# Patient Record
Sex: Male | Born: 1965 | Hispanic: No | Marital: Married | Smoking: Never smoker
Health system: Southern US, Community
[De-identification: ages and names within clinical notes are randomized; demographics above are authoritative.]

## PROBLEM LIST (undated history)

## (undated) DIAGNOSIS — Z972 Presence of dental prosthetic device (complete) (partial): Secondary | ICD-10-CM

## (undated) DIAGNOSIS — E119 Type 2 diabetes mellitus without complications: Secondary | ICD-10-CM

## (undated) HISTORY — PX: CHOLECYSTECTOMY: SHX55

## (undated) HISTORY — PX: APPENDECTOMY: SHX54

---

## 2001-02-15 ENCOUNTER — Emergency Department (HOSPITAL_COMMUNITY): Admission: EM | Admit: 2001-02-15 | Discharge: 2001-02-15 | Payer: Self-pay

## 2004-06-02 ENCOUNTER — Inpatient Hospital Stay: Payer: Self-pay

## 2004-06-02 ENCOUNTER — Other Ambulatory Visit: Payer: Self-pay

## 2004-06-22 ENCOUNTER — Ambulatory Visit: Payer: Self-pay

## 2004-11-20 ENCOUNTER — Other Ambulatory Visit: Payer: Self-pay

## 2004-11-20 ENCOUNTER — Emergency Department: Payer: Self-pay | Admitting: Emergency Medicine

## 2005-01-05 ENCOUNTER — Emergency Department: Payer: Self-pay | Admitting: Emergency Medicine

## 2005-01-05 ENCOUNTER — Other Ambulatory Visit: Payer: Self-pay

## 2005-01-25 ENCOUNTER — Ambulatory Visit: Payer: Self-pay

## 2006-05-07 ENCOUNTER — Emergency Department: Payer: Self-pay | Admitting: Emergency Medicine

## 2006-11-09 ENCOUNTER — Ambulatory Visit: Payer: Self-pay | Admitting: Internal Medicine

## 2007-09-28 ENCOUNTER — Ambulatory Visit: Payer: Self-pay | Admitting: Internal Medicine

## 2008-09-08 ENCOUNTER — Ambulatory Visit: Payer: Self-pay | Admitting: Internal Medicine

## 2009-02-26 ENCOUNTER — Emergency Department: Payer: Self-pay | Admitting: Emergency Medicine

## 2009-03-03 ENCOUNTER — Ambulatory Visit: Payer: Self-pay | Admitting: Gastroenterology

## 2010-12-13 ENCOUNTER — Other Ambulatory Visit: Payer: Self-pay | Admitting: *Deleted

## 2010-12-13 ENCOUNTER — Ambulatory Visit
Admission: RE | Admit: 2010-12-13 | Discharge: 2010-12-13 | Disposition: A | Discharge status: Home / Self Care | Payer: Self-pay | Source: Ambulatory Visit | Attending: *Deleted | Admitting: *Deleted

## 2010-12-13 DIAGNOSIS — M545 Low back pain: Secondary | ICD-10-CM

## 2010-12-13 DIAGNOSIS — Z139 Encounter for screening, unspecified: Secondary | ICD-10-CM

## 2010-12-14 ENCOUNTER — Ambulatory Visit
Admission: RE | Admit: 2010-12-14 | Discharge: 2010-12-14 | Disposition: A | Discharge status: Home / Self Care | Payer: Self-pay | Source: Ambulatory Visit | Attending: Anesthesiology | Admitting: Anesthesiology

## 2010-12-14 ENCOUNTER — Other Ambulatory Visit: Payer: Self-pay | Admitting: Anesthesiology

## 2010-12-15 ENCOUNTER — Ambulatory Visit
Admission: RE | Admit: 2010-12-15 | Discharge: 2010-12-15 | Disposition: A | Discharge status: Home / Self Care | Payer: Self-pay | Source: Ambulatory Visit | Attending: *Deleted | Admitting: *Deleted

## 2010-12-15 DIAGNOSIS — M545 Low back pain: Secondary | ICD-10-CM

## 2010-12-15 MED ORDER — GADOBENATE DIMEGLUMINE 529 MG/ML IV SOLN
16.0000 mL | Freq: Once | INTRAVENOUS | Status: AC | PRN
  Administered 2010-12-15: 16 mL via INTRAVENOUS

## 2010-12-22 ENCOUNTER — Ambulatory Visit: Payer: Self-pay | Admitting: Gastroenterology

## 2010-12-24 LAB — PATHOLOGY REPORT

## 2011-01-04 ENCOUNTER — Other Ambulatory Visit: Payer: Self-pay | Admitting: Internal Medicine

## 2011-01-04 DIAGNOSIS — R1110 Vomiting, unspecified: Secondary | ICD-10-CM

## 2011-01-05 ENCOUNTER — Other Ambulatory Visit: Payer: Self-pay | Admitting: Internal Medicine

## 2011-01-05 ENCOUNTER — Ambulatory Visit
Admission: RE | Admit: 2011-01-05 | Discharge: 2011-01-05 | Disposition: A | Discharge status: Home / Self Care | Payer: No Typology Code available for payment source | Source: Ambulatory Visit | Attending: Internal Medicine | Admitting: Internal Medicine

## 2011-01-05 ENCOUNTER — Inpatient Hospital Stay: Admission: RE | Admit: 2011-01-05 | Payer: Self-pay | Source: Ambulatory Visit

## 2011-01-05 MED ORDER — IOHEXOL 300 MG/ML  SOLN
100.0000 mL | Freq: Once | INTRAMUSCULAR | Status: AC | PRN
  Administered 2011-01-05: 100 mL via INTRAVENOUS

## 2013-12-27 ENCOUNTER — Inpatient Hospital Stay: Payer: Self-pay | Admitting: Surgery

## 2013-12-27 LAB — URINALYSIS, COMPLETE
BACTERIA: NEGATIVE
Bilirubin,UR: NEGATIVE
Blood: NEGATIVE
GLUCOSE, UR: NEGATIVE mg/dL (ref 0–75)
Ketone: NEGATIVE
Leukocyte Esterase: NEGATIVE
NITRITE: NEGATIVE
PROTEIN: NEGATIVE
Ph: 7 (ref 4.5–8.0)
RBC,UR: NONE SEEN /HPF (ref 0–5)
Specific Gravity: 1.046 (ref 1.003–1.030)
WBC UR: NONE SEEN /HPF (ref 0–5)

## 2013-12-27 LAB — LIPASE, BLOOD: Lipase: 156 U/L (ref 73–393)

## 2013-12-27 LAB — CBC WITH DIFFERENTIAL/PLATELET
Basophil #: 0 10*3/uL (ref 0.0–0.1)
Basophil %: 0.2 %
Eosinophil #: 0 10*3/uL (ref 0.0–0.7)
Eosinophil %: 0 %
HCT: 42.9 % (ref 40.0–52.0)
HGB: 13.6 g/dL (ref 13.0–18.0)
Lymphocyte #: 0.6 10*3/uL — ABNORMAL LOW (ref 1.0–3.6)
Lymphocyte %: 5.6 %
MCH: 26.6 pg (ref 26.0–34.0)
MCHC: 31.8 g/dL — ABNORMAL LOW (ref 32.0–36.0)
MCV: 84 fL (ref 80–100)
Monocyte #: 0.5 x10 3/mm (ref 0.2–1.0)
Monocyte %: 5 %
Neutrophil #: 9.8 10*3/uL — ABNORMAL HIGH (ref 1.4–6.5)
Neutrophil %: 89.2 %
Platelet: 162 10*3/uL (ref 150–440)
RBC: 5.12 10*6/uL (ref 4.40–5.90)
RDW: 14.4 % (ref 11.5–14.5)
WBC: 11 10*3/uL — ABNORMAL HIGH (ref 3.8–10.6)

## 2013-12-27 LAB — COMPREHENSIVE METABOLIC PANEL
ALK PHOS: 87 U/L
Albumin: 4 g/dL (ref 3.4–5.0)
Anion Gap: 9 (ref 7–16)
BUN: 12 mg/dL (ref 7–18)
Bilirubin,Total: 0.8 mg/dL (ref 0.2–1.0)
CALCIUM: 8.9 mg/dL (ref 8.5–10.1)
CHLORIDE: 104 mmol/L (ref 98–107)
CO2: 27 mmol/L (ref 21–32)
Creatinine: 1.1 mg/dL (ref 0.60–1.30)
EGFR (Non-African Amer.): >60
GLUCOSE: 191 mg/dL — AB (ref 65–99)
Osmolality: 284 (ref 275–301)
Potassium: 4.1 mmol/L (ref 3.5–5.1)
SGOT(AST): 21 U/L (ref 15–37)
SGPT (ALT): 21 U/L (ref 12–78)
Sodium: 140 mmol/L (ref 136–145)
Total Protein: 7.9 g/dL (ref 6.4–8.2)

## 2013-12-27 LAB — SEDIMENTATION RATE: ERYTHROCYTE SED RATE: 6 mm/h (ref 0–15)

## 2013-12-27 LAB — TROPONIN I: Troponin-I: <0.02 ng/mL

## 2013-12-27 LAB — HEMOGLOBIN A1C: Hemoglobin A1C: 6.2 % (ref 4.2–6.3)

## 2013-12-28 LAB — CBC WITH DIFFERENTIAL/PLATELET
BASOS ABS: 0 10*3/uL (ref 0.0–0.1)
Basophil %: 0.1 %
Eosinophil #: 0 10*3/uL (ref 0.0–0.7)
Eosinophil %: 0 %
HCT: 36.2 % — ABNORMAL LOW (ref 40.0–52.0)
HGB: 12.2 g/dL — ABNORMAL LOW (ref 13.0–18.0)
Lymphocyte #: 0.6 10*3/uL — ABNORMAL LOW (ref 1.0–3.6)
Lymphocyte %: 6.7 %
MCH: 28.3 pg (ref 26.0–34.0)
MCHC: 33.6 g/dL (ref 32.0–36.0)
MCV: 84 fL (ref 80–100)
MONO ABS: 0.3 x10 3/mm (ref 0.2–1.0)
MONOS PCT: 3.2 %
NEUTROS ABS: 7.8 10*3/uL — AB (ref 1.4–6.5)
NEUTROS PCT: 90 %
Platelet: 141 10*3/uL — ABNORMAL LOW (ref 150–440)
RBC: 4.31 10*6/uL — AB (ref 4.40–5.90)
RDW: 14.5 % (ref 11.5–14.5)
WBC: 8.7 10*3/uL (ref 3.8–10.6)

## 2013-12-28 LAB — COMPREHENSIVE METABOLIC PANEL
ALK PHOS: 70 U/L
ALT: 16 U/L (ref 12–78)
ANION GAP: 6 — AB (ref 7–16)
Albumin: 3.2 g/dL — ABNORMAL LOW (ref 3.4–5.0)
BUN: 11 mg/dL (ref 7–18)
Bilirubin,Total: 0.8 mg/dL (ref 0.2–1.0)
CALCIUM: 8.5 mg/dL (ref 8.5–10.1)
CO2: 27 mmol/L (ref 21–32)
CREATININE: 0.84 mg/dL (ref 0.60–1.30)
Chloride: 108 mmol/L — ABNORMAL HIGH (ref 98–107)
EGFR (Non-African Amer.): >60
Glucose: 152 mg/dL — ABNORMAL HIGH (ref 65–99)
Osmolality: 284 (ref 275–301)
POTASSIUM: 4.2 mmol/L (ref 3.5–5.1)
SGOT(AST): 15 U/L (ref 15–37)
Sodium: 141 mmol/L (ref 136–145)
TOTAL PROTEIN: 6.5 g/dL (ref 6.4–8.2)

## 2013-12-29 LAB — CBC WITH DIFFERENTIAL/PLATELET
Basophil #: 0 x10 3/mm 3
Basophil %: 0.1 %
Eosinophil #: 0 x10 3/mm 3
Eosinophil %: 0.1 %
HCT: 36.5 % — ABNORMAL LOW
HGB: 11.9 g/dL — ABNORMAL LOW
Lymphocyte %: 9.2 %
Lymphs Abs: 0.8 x10 3/mm 3 — ABNORMAL LOW
MCH: 27.7 pg
MCHC: 32.7 g/dL
MCV: 85 fL
Monocyte #: 0.4 "x10 3/mm "
Monocyte %: 4.5 %
Neutrophil #: 7.4 x10 3/mm 3 — ABNORMAL HIGH
Neutrophil %: 86.1 %
Platelet: 149 x10 3/mm 3 — ABNORMAL LOW
RBC: 4.3 x10 6/mm 3 — ABNORMAL LOW
RDW: 14.7 % — ABNORMAL HIGH
WBC: 8.6 x10 3/mm 3

## 2013-12-29 LAB — COMPREHENSIVE METABOLIC PANEL WITH GFR
Albumin: 3.4 g/dL
Alkaline Phosphatase: 68 U/L
Anion Gap: 7
BUN: 13 mg/dL
Bilirubin,Total: 0.6 mg/dL
Calcium, Total: 9 mg/dL
Chloride: 106 mmol/L
Co2: 26 mmol/L
Creatinine: 0.89 mg/dL
EGFR (African American): >60
EGFR (Non-African Amer.): >60
Glucose: 180 mg/dL — ABNORMAL HIGH
Osmolality: 282
Potassium: 3.9 mmol/L
SGOT(AST): 15 U/L
SGPT (ALT): 16 U/L
Sodium: 139 mmol/L
Total Protein: 7.2 g/dL

## 2013-12-29 LAB — MAGNESIUM: MAGNESIUM: 2.1 mg/dL

## 2014-10-18 NOTE — Consult Note (Signed)
Brief Consult Note: Diagnosis: Diabetes, GERD, partial SBO, suspected Crohn's disease.   Patient was seen by consultant.   Consult note dictated.   Recommend further assessment or treatment.   Orders entered.   Comments: 1. Diabetes, restart janumet, add SSI, get Hgb a1c, follwo BG and start on long acting insulin if needed 2. GERD, PPI 3. partial SBO, NGT and IVF, pain meds, management per surgery 4. suspected Crohn's disease contineu solumedrol, add cipro/flagyl IV Thanks for consult, we'll follow.  Electronic Signatures: Katharina Caper (MD)  (Signed 03-Jul-15 21:44)  Authored: Brief Consult Note   Last Updated: 03-Jul-15 21:44 by Katharina Caper (MD)

## 2014-10-18 NOTE — Consult Note (Signed)
Pt abdomen flat, non tender, bothered by nose irritation but tol well.  Met C ok except glucose 180.  VSS afebrile.  Given the rapidity which he got better and the vast improvement  I think this was due to adhesions of small bowel.  Do study tomorrow.  Consider clamp of NG tube at this time but will leave that up to Dr. Burt Knack.  Electronic Signatures: Manya Silvas (MD)  (Signed on 05-Jul-15 11:12)  Authored  Last Updated: 05-Jul-15 11:12 by Manya Silvas (MD)

## 2014-10-18 NOTE — Consult Note (Signed)
PATIENT NAME:  Timothy Hancock, Timothy Hancock MR#:  [id number] DATE OF BIRTH:  1966/06/02  DATE OF CONSULTATION:  12/27/2013  PRIMARY CARE PHYSICIAN:  Unknown.  REFERRING PHYSICIAN:  Scot Jun, MD   CONSULTING PHYSICIAN:  Katharina Caper, MD  REASON FOR CONSULTATION: In regards to diabetes management.   HISTORY OF PRESENT ILLNESS:  The patient is a 49 year old [place] gentleman who has lived in the [place] for the past 30 years, who presents to the hospital with complaints of severe abdominal pains.  According to the patient, he ate dinner yesterday and after he ate dinner, he started having severe abdominal pain, describes the pain as sharp, constant around the umbilical area, accompanied by nausea, vomiting. Because of the nausea, vomiting and  significant relentless abdominal pain, he decided to come to the Emergency Room. In the Emergency Room, he was noted to have partial small bowel obstruction, and the patient is being admitted by surgery. Because of concerns about possible Crohn's disease and initiation of steroids, hospitalist services were contacted also to help to manage diabetes. The patient admits of having a good bowel movement today in the morning; no diarrheal stool. His abdominal pain somewhat subsided with a G-tube placement today.  PAST MEDICAL HISTORY:  Significant for history of weight loss, approximately 50 to 60 pounds approximately 5 years ago, nonintentional, diabetes mellitus, gastroesophageal reflux disease, history of cholecystectomy, as well as appendectomy in the remote past; laparoscopically done.  MEDICATIONS:  Janumet 50/500, 1 tablet twice daily. Most recent hemoglobin A1c on the patient was 7.1.   SURGERIES: As above.   ALLERGIES: None.   FAMILY HISTORY: The patient's father had diabetes and died at the age of 23 of complications of diabetes.   SOCIAL HISTORY: The patient is married, has 4 children; 2 boys, 2 girls.  No smoking or alcohol abuse. He works  Sales promotion account executive.   REVIEW OF SYSTEMS: Positive for pains in the abdomen, weight loss of approximately 50 to 60 pounds 5 years ago, now his weight is stable. He admits to have some cold sweats and chills earlier today. Also nausea, vomiting, as well as abdominal pains. Most recent colonoscopy, as well as EGD done  approximately five years ago by Dr. Niel Hummer and those were unremarkable. Otherwise, denies fevers or chills, fatigue, weakness, weight gain.  EYES: Denies any blurred vision, vision, glaucoma or cataracts.  ENT: Denies any tinnitus, allergies, epistaxis, sinus tenderness, difficulty swallowing. He denies any cough, wheezes, asthma, chronic obstructive pulmonary disease.  CARDIOVASCULAR: Denies chest pains, orthopnea, arrhythmias, palpitations or syncope.  GASTROINTESTINAL: Denies any diarrhea, rectal bleeding, change in bowel habits.  GENITOURINARY: Denies dysuria, hematuria, frequency or incontinence.   ENDOCRINOLOGY: Denies any polydipsia, nocturia,  thyroid problems, heat or cold intolerance or thirst.   HEMATOLOGIC: Denies anemia, easy bruising, bleeding or swollen glands.  SKIN: Denies any acne, rashes or changes in moles.  MUSCULOSKELETAL:  Denies arthritis, cramps, swelling. NEUROLOGICAL: Denies numbness, epilepsy or tremors.  PSYCHIATRIC: Denies anxiety, insomnia, depression.   PHYSICAL EXAMINATION: VITAL SIGNS: On arrival to the hospital, temperature is 98.7. Pulse was 80, respiration was 18, blood pressure 117/70, saturation was 96% on room air.  GENERAL: This is a well-developed, well-nourished, [place]  male, in no significant distress, lying on the stretcher.  HEENT: His pupils are equal and reactive to light. Extraocular movements were intact. . There is fullness in her axilla and no hearing. No pharyngeal erythema. Mucosa is moist.  NECK: No masses, supple, nontender. Thyroid is not enlarged. No  adenopathy. No JVD or carotid bruits bilaterally. Full range of motion.   LUNGS: Clear to auscultation in all fields, diminished breath sounds at bases, but, otherwise, no rales, rhonchi or wheezing. No labored inspirations, increased effort, dullness to percussion. Not in overt respiratory distress.  CARDIOVASCULAR: S1, S2 appreciated. No murmurs, gallops or rubs. PMI not lateralized.  Chest is nontender to palpation. Palpable pulses. No lower extremity edema, calf tenderness or cyanosis.    ABDOMEN: Soft, tender in the epigastric as well as periumbilical area. The patient's abdomen is a little bloated, distended. No hepatosplenomegaly or masses were noted.  Bowel sounds are present, though diminished.  RECTAL: Deferred.  MUSCULOSKELETAL:  Muscle strength: Able to move all extremities. No swelling, cyanosis, degenerative joint disease or kyphosis. Gait is not tested.  SKIN: Did not reveal any rashes, lesions, erythema, nodularity or induration. It was warm and dry to palpation.  LYMPHATIC: No adenopathy in the cervical region.  NEUROLOGIC: Cranial nerves grossly intact. Sensory intact. The patient is alert, oriented to time, person and place, cooperative. Memory is good and no significant confusion, agitation or depression noted.   LABORATORY DATA: BMP: Glucose of 191; otherwise BMP was unremarkable. Lipase level of 156.  Liver enzymes were normal. Troponin less than 0.02. White blood count is elevated to 11.0, hemoglobin was 13.6, platelet count 162, neutrophil count is 9.8. Urinalysis is unremarkable.   RADIOLOGIC STUDIES: PA of chest, 12/27/2013, revealed dilated small bowel loops in left upper quadrant, which may reflect an ileus versus small bowel obstruction.   ABDOMEN: AP only x-ray, 12/27/2013, revealed advancement of the right nasogastric tube by 5 to 10 cm, to assure that the tube tip and proximal port remain below the EG junction. CT of abdomen and pelvis with contrast, 12/27/2013, revealed a small bowel obstruction which  appeared to be secondary to diffuse  enteritis involving portions of the ileum and differential considerations or infectious versus inflammatory causes, small amount of ascites in the abdomen or pelvis. No drainable loculated fluid collections. No evidence of free air.   ASSESSMENT AND PLAN: 1.  Diabetes mellitus. We will start the patient on Janumet, add also sliding scale insulin and we will get hemoglobin A1c. Will follow up glucose levels and start patient on long-acting insulin if needed.  2.  Gastroesophageal reflux disease. Continue proton pump inhibitor.  3. Partial small bowel obstruction. We will continue the patient with an NG tube, as well as intravenous fluids and pain medications. Management per surgery and gastroenterology.  4.  We will continue Solu-Medrol, add Cipro and Flagyl intravenously.     TIME SPENT: 50 minutes on this patient.    ____________________________ Katharina Caper, MD rv:aj D: 12/27/2013 21:51:47 ET T: 12/28/2013 02:26:28 ET JOB#: [id number]  cc: Katharina Caper, MD, <Dictator> Joniece Smotherman MD ELECTRONICALLY SIGNED 01/16/2014 16:15

## 2014-10-18 NOTE — Consult Note (Signed)
Pt with partial small bowel obst now with abnormal findings on CT and plain films.  Discussed with Dr. Excell Seltzer as main differential is between adhesions from previous surgery and Crohn's disease.  Hx of gall bladder surgery and appendectomy and oral agent controlled diabetes.  Will get hospitalist to manage his glucose since I expect it to rise a lot with iv steroids which we will give as a trial.  His NG tube may need to be advanced and a stat x-ray was ordered.  Will follow with you, check stat CRP and sed rate.  Electronic Signatures: Scot Jun (MD)  (Signed on 03-Jul-15 18:39)  Authored  Last Updated: 03-Jul-15 18:39 by Scot Jun (MD)

## 2014-10-18 NOTE — H&P (Signed)
PATIENT NAME:  Timothy Hancock, Timothy Hancock MR#:  [id number] DATE OF BIRTH:  02/29/1966  DATE OF ADMISSION:  12/27/2013  CHIEF COMPLAINT: Epigastric and left upper quadrant pain.   HISTORY OF PRESENT ILLNESS: This a patient with acute onset of epigastric and left upper quadrant pain after eating last night. He has never had an episode like this before. He has had multiple emeses most recently about an hour prior to being seen in the ER by me. He has never had an episode like this before. He has no recent foreign travel. No one else in his family is ill and he states that everyone ate the same meal last night. He is passing gas and having bowel movements without melena or hematochezia, and no hematemesis.   Dr. Mayford Knife asked me to see the patient in the ED for possible bowel obstruction versus enteritis seen on CT scan. The patient describes no history or knowledge of a family history of Crohn's disease, had not heard that word before.   PAST MEDICAL HISTORY: Diabetes and reflux disease.   PAST SURGICAL HISTORY: Cholecystectomy and appendectomy, both performed laparoscopically in the remote past.   ALLERGIES: NONE.   MEDICATIONS: Januvia and a proton pump inhibitor.   SOCIAL HISTORY: The patient does not smoke or drink. He sells flooring products.  REVIEW OF SYSTEMS: Ten system review is performed and negative with the exception of that mentioned in the HPI.   PHYSICAL EXAMINATION:  GENERAL: Healthy, comfortable -appearing male patient.  VITAL SIGNS: Temperature 98.5, pulse 71, respirations 20, blood pressure 130/74, 99% room air saturation. Pain scale is 9-10.  HEENT: No scleral icterus.  NECK: No palpable neck nodes.  CHEST: Clear to auscultation.  CARDIAC: Regular rate and rhythm.  ABDOMEN: Distended, tympanitic, tender in the left upper quadrant with some minimal guarding. No rebound tenderness.  EXTREMITIES: Without edema.  NEUROLOGIC: Grossly intact.  INTEGUMENT: No jaundice.    LABORATORY, DIAGNOSTIC AND RADIOLOGICAL DATA: CT scan is personally reviewed. There is a transition area suggestive of regional enteritis where proximal to this is quite dilated in the mid ileum. White blood cell count is 11, hemoglobin and hematocrit of 13.6 and 42.9 and a platelet count 162,000. Electrolytes are within normal limits. Glucose of 191, lipase 156.   ASSESSMENT AND PLAN: This is a patient where the suspicion for regional enteritis is fairly high. On CT scan, he has a transition area that is suggestive of regional enteritis. He has no history either in the family or in his personal knowledge of Crohn's disease and this could be infectious in nature. I will admit the patient, hydrate him and recheck his electrolytes and also ask GI to see the patient concerning the possibility of additional testing to assess for regional enteritis.  This was discussed with Dr. Mayford Knife in the ED.    ____________________________ Adah Salvage. Excell Seltzer, MD rec:ts D: 12/27/2013 14:56:48 ET T: 12/27/2013 16:57:23 ET JOB#: [id number]  cc: Adah Salvage. Excell Seltzer, MD, <Dictator> Lattie Haw MD ELECTRONICALLY SIGNED 12/28/2013 9:11

## 2014-10-18 NOTE — Consult Note (Signed)
Discussed patient with Dr. Juliann Pulse.  Likely SBO from adhesions which resolved with NG suction.  I stopped the solumedrol.  No further GI recommendations.  I will sign off, reconsult if needed.  Electronic Signatures: Scot Jun (MD)  (Signed on 06-Jul-15 16:27)  Authored  Last Updated: 06-Jul-15 16:27 by Scot Jun (MD)

## 2014-10-18 NOTE — Discharge Summary (Signed)
PATIENT NAME:  Timothy Hancock, Timothy Hancock MR#:  [id number] DATE OF BIRTH:  March 22, 1966  DATE OF ADMISSION:  12/27/2013 DATE OF DISCHARGE:  12/31/2013  DIAGNOSES: Diabetes, reflux disease, and enteritis.   CONSULTANTS: GI.   PROCEDURES: None.   HISTORY OF PRESENT ILLNESS AND HOSPITAL COURSE: This is a patient who had the acute onset of epigastric and right upper quadrant pain and a workup suggested ileus versus enteritis without obvious bowel obstruction. He was seen in the ER and admitted for observation. A nasogastric tube was placed and the patient's pain resolved and he started passing gas and having stools. A small bowel series was performed that failed to identify any fixed mechanical obstruction. He was discharged in stable condition to follow up with gastroenterology in the next 2 weeks.    ____________________________ Adah Salvage Excell Seltzer, MD rec:lt D: 01/08/2014 00:02:57 ET T: 01/08/2014 02:57:53 ET JOB#: [id number]  cc: Adah Salvage. Excell Seltzer, MD, <Dictator> Lattie Haw MD ELECTRONICALLY SIGNED 01/08/2014 19:15

## 2014-10-18 NOTE — Consult Note (Signed)
Pt abd signif less distended and less tender on palpation. NG was advanced after x-ray done.   VSS afebrile, sed rat and  CRP are normal.  PT started on steroids solumedrol 20mg  iv q 8 hours.  Alb 3.2 WBC 8.7, hgb 12.2.   20-25% of IBD Crohn's patients may have absense of elevated sed rate and CRP, but 80% will.  Discussed with Dr. Excell Seltzer about trying to make an accurate dx and it may take a laproscopic exam, it may be done using UGI and SBF with barium or gastrograffin.  Will plan for Monday for this test.  Electronic Signatures: Scot Jun (MD)  (Signed on 04-Jul-15 12:39)  Authored  Last Updated: 04-Jul-15 12:39 by Scot Jun (MD)

## 2014-10-18 NOTE — Consult Note (Signed)
PATIENT NAME:  Timothy Hancock, Timothy Hancock MR#:  [id number] DATE OF BIRTH:  1965-08-21  DATE OF CONSULTATION:  12/27/2013  CONSULTING PHYSICIAN:  Scot Jun, MD  HISTORY OF PRESENT ILLNESS: The patient is a 49 year old native of [place], lived in the [place] for the last 30 years, who presented with a rather abrupt onset of abdominal pain and vomiting. Pain mostly in the left mid abdomen, mid upper. He came to the ER and abdominal films and CAT scan showed some small bowel dilatation with some of abrupt cutoff areas suggesting a partial small bowel obstruction and he was admitted to the hospital by Dr. Excell Seltzer and I was asked to see him in consultation because of the possibility of Crohn disease.   FAMILY HISTORY: His father died of diabetes.  The patient was diagnosed with diabetes several years ago, initially put on insulin. He has lost a significant amount of weight, about 60 pounds in all and he got off insulin and has been on oral agent treatment since then.   In 1982, had appendectomy. In 1998, gallbladder disease surgery, 2003 diagnosed with diabetes.   The patient says that he had no significant problem after the surgery. He had an upper endoscopy 5 years ago and an ultrasound of the abdomen for left upper quadrant abdominal pain. I did not show any abnormalities but the pain was similar to what he is experiencing now.   REVIEW OF SYSTEMS: He has had some nausea and cold sweats. No shortness of breath. No coughing. No melena. No bright red blood per rectum. No arthritic symptoms. No rash and no fever. He is voiding okay and moving his bowels okay. He drinks city water.   ALLERGIES: None.  MEDICATIONS: Januvia and a PPI.   HABITS: Does not smoke and drink.   PHYSICAL EXAMINATION:  GENERAL: Adult male in no acute distress with an NG tube in. Tube may not be far enough down in his abdomen.  VITAL SIGNS: Temperature 97.6, pulse 80, respirations 22, blood pressure 135/78.  HEENT:  Sclerae anicteric. Conjunctivae negative.  HEAD: Atraumatic NG tube in the right nostril.  CHEST: Clear.  HEART: No murmurs or gallops I can hear.  ABDOMEN: Tender in the left mid abdomen mostly, less tender elsewhere. Bowel sounds are present. No palpable organomegaly.  SKIN: Warm and dry. EXTREMITIES: Legs show no edema.   LABORATORY DATA: Glucose 190, BUN 12, creatinine 1.1, sodium 140, potassium 4.1, chloride 104, CO2 of 27, calcium 8.9, lipase 156, total protein 7.9, albumin. Bilirubin is 0.8. Alkaline phosphatase 87, AST 21, ALT 21. Troponin negative. White count 11,000, hemoglobin 13.6, hematocrit 42.9, platelet count 162,000. Urinalysis shows no significant abnormality.   CAT scan of the abdomen consistent with a small bowel obstruction, which appears to be secondary to  a diffuse enteritis involving portions of the ileum. Differential is infectious versus inflammatory condition with a small amount of ascites present.   ASSESSMENT: Crohn's disease is a real possibility given his small bowel abnormality. Multifocal small bowel malignancy is a very rare possibility. Adhesions from previous surgery is also a possibility. The case was discussed with Dr. Excell Seltzer. We are going to go ahead and put him on some IV Solu-Medrol. If this is Crohn's disease, it should help inflammation within a few days. Because he is a diabetic and because the Solu-Medrol is going to drive up his blood sugar, I am going to get a hospitalist to see him and manage his diabetes. We will get a flat plate  of the abdomen to check NG placement, may need to be advanced some. Will follow with you.     ____________________________ Scot Jun, MD rte:lt D: 12/27/2013 18:42:24 ET T: 12/27/2013 19:30:36 ET JOB#: [id number]  cc: Scot Jun, MD, <Dictator> Richard E. Excell Seltzer, MD Katharina Caper, MD Scot Jun MD ELECTRONICALLY SIGNED 01/07/2014 17:47

## 2015-05-23 IMAGING — CR DG ABDOMEN 2V
1 series · 4 of 4 positions shown · non-contrast
Comparison: Two-view abdomen 12/27/2013.

CLINICAL DATA: Small bowel obstruct

EXAM:
ABDOMEN - 2 VIEW

[Series 5: x abdomen supine · 0.14mm/px · 4 of 4 slices shown]
[im 1/4]
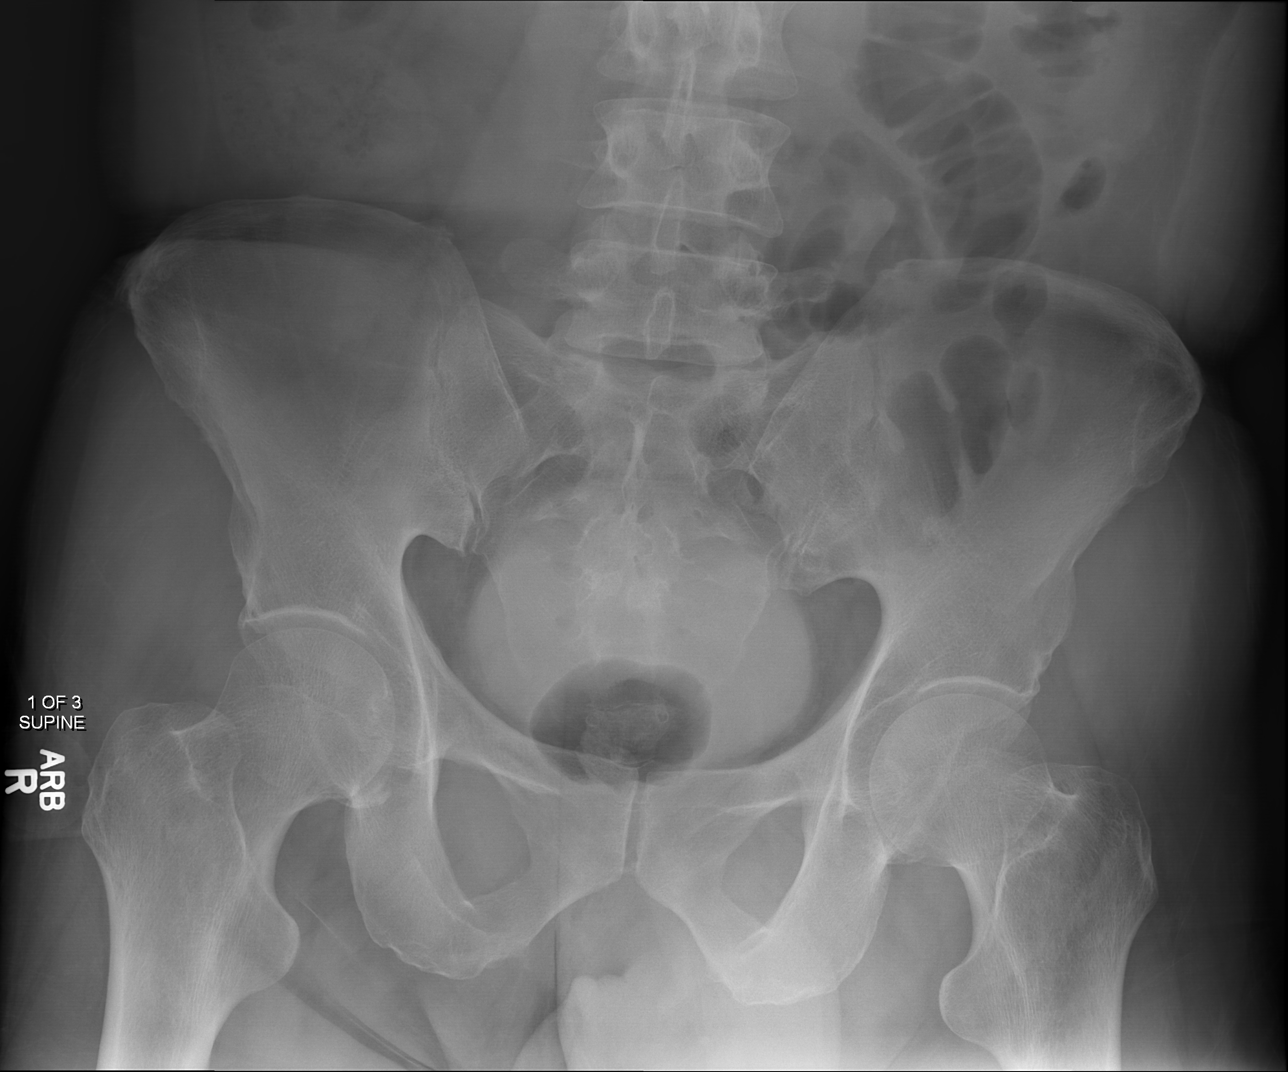
[im 2/4]
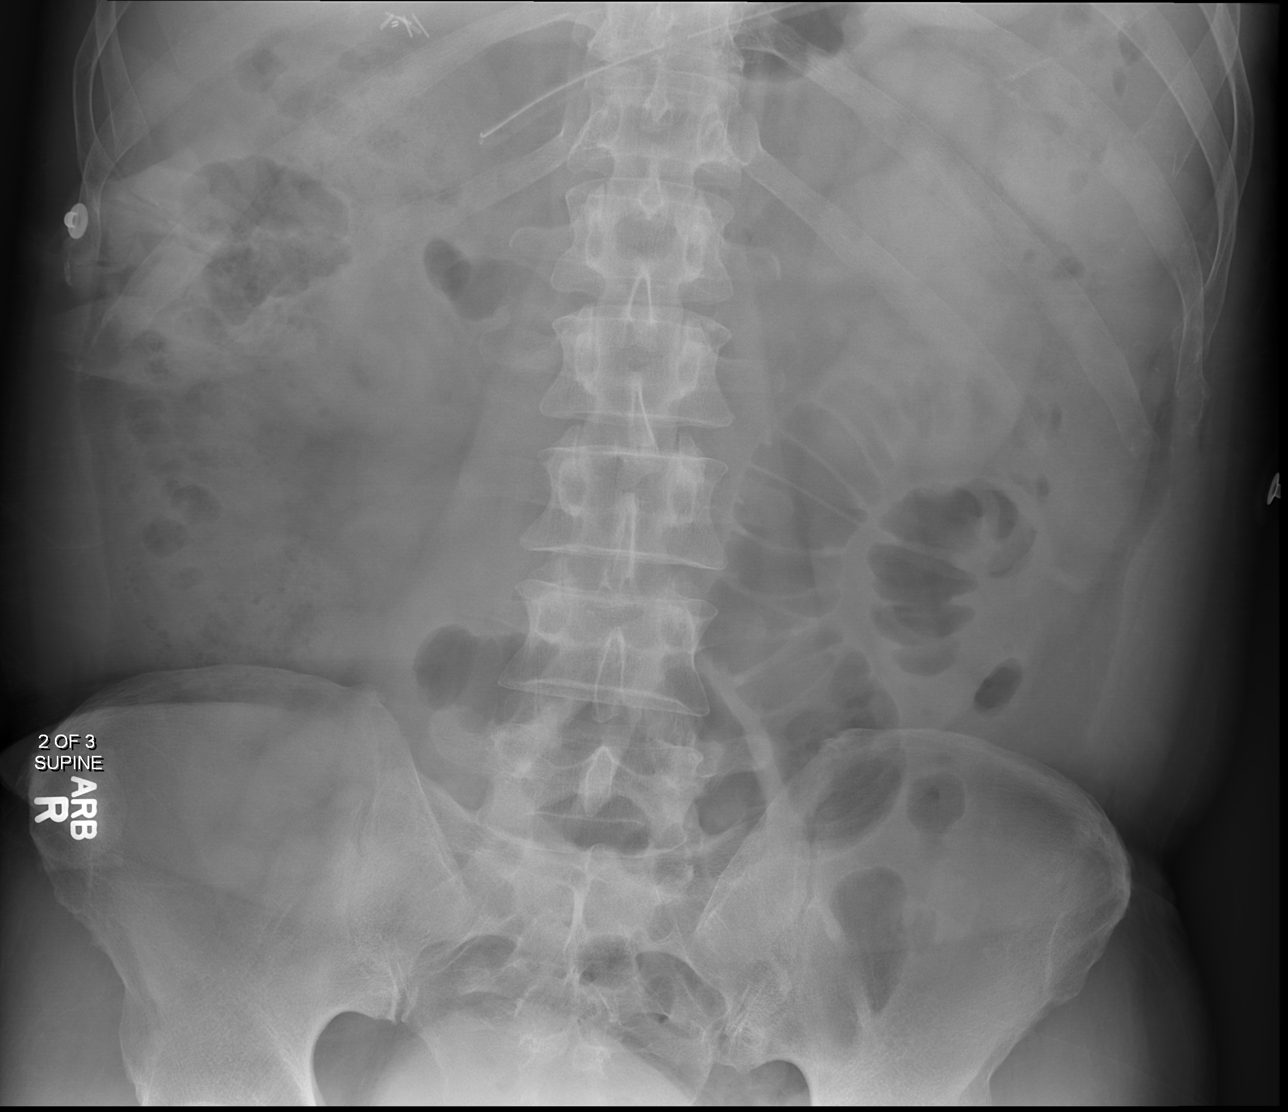
[im 3/4]
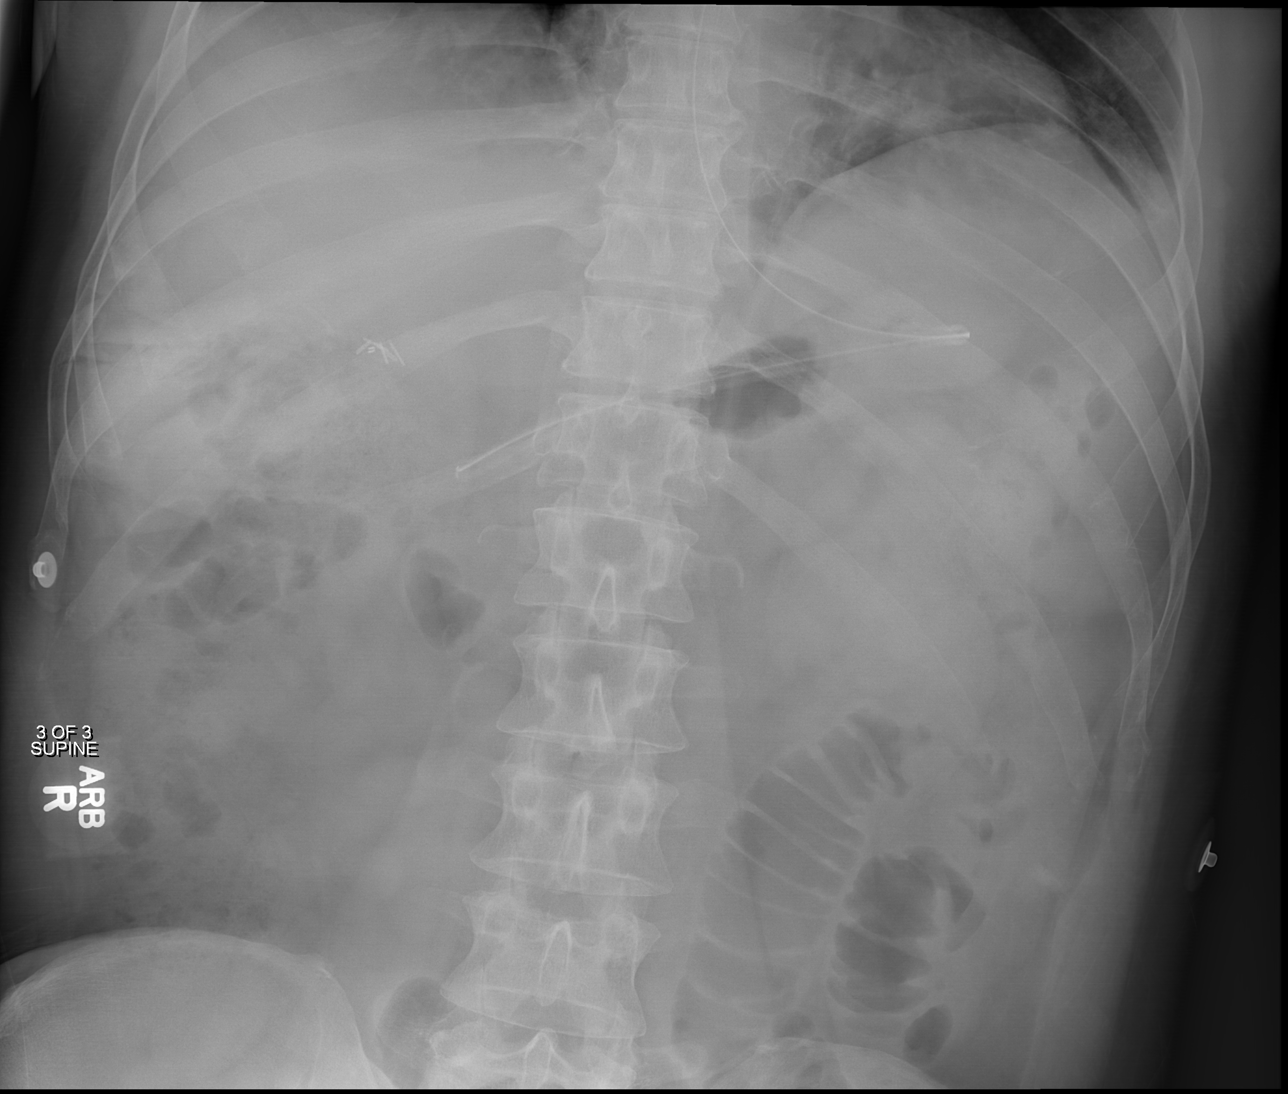
[im 4/4]
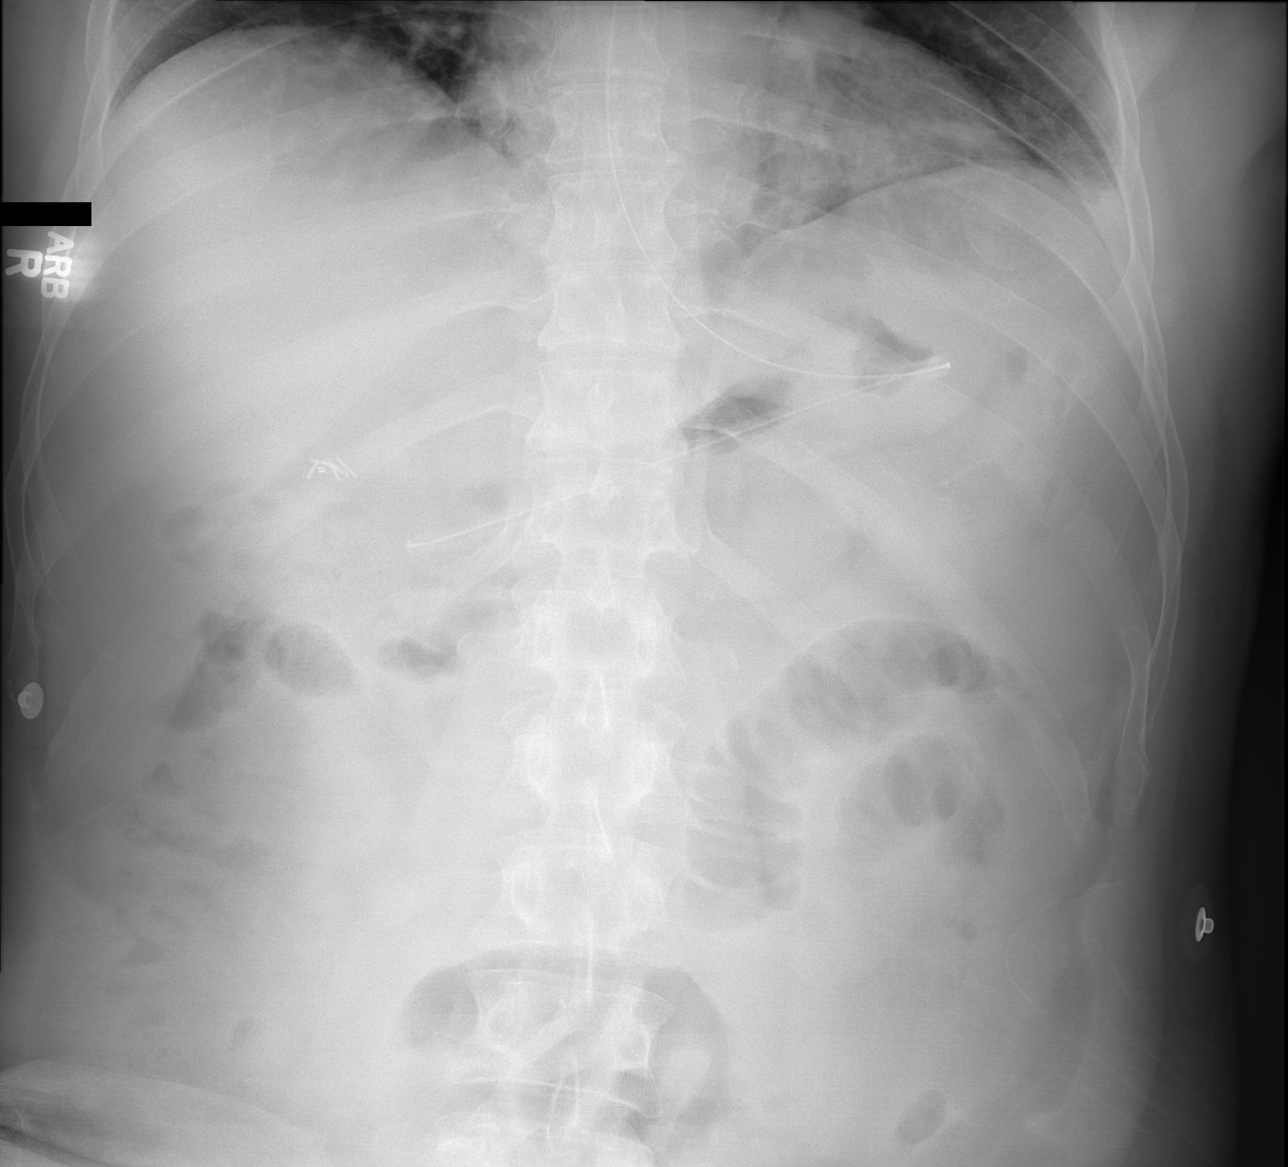

[4 of 4 positions shown; findings below may reference images not displayed]

FINDINGS: A dilated loop of small bowel in the left lower quadrant with
air-fluid levels is similar to the prior study. Gas and stool are
present within the colon. The NG tube is in satisfactory position
with decompression of the stomach and proximal small bowel. No free
air is present.
IMPRESSION: 1. Persistent focal dilation of small bowel in the left lower
quadrant with slight improvement suggesting resolving small bowel
obstruction or ileus.
2. Better visualization of gas within the colon.
3. No free air.

## 2015-05-25 IMAGING — CR DG SMALL BOWEL
1 series · 2 of 2 positions shown · non-contrast
Comparison: Abdominal series of December 29, 2013

CLINICAL DATA: History of enteritis with previous episodes of
obstruction

EXAM:
SMALL BOWEL SERIES
TECHNIQUE: Following ingestion of thin barium, serial small bowel images were
obtained including spot views of the terminal ileum.
FLUOROSCOPY TIME:  0 min 30 seconds

[Series 1: 15 minute · 0.17mm/px · 2 of 2 slices shown]
[im 1/2]
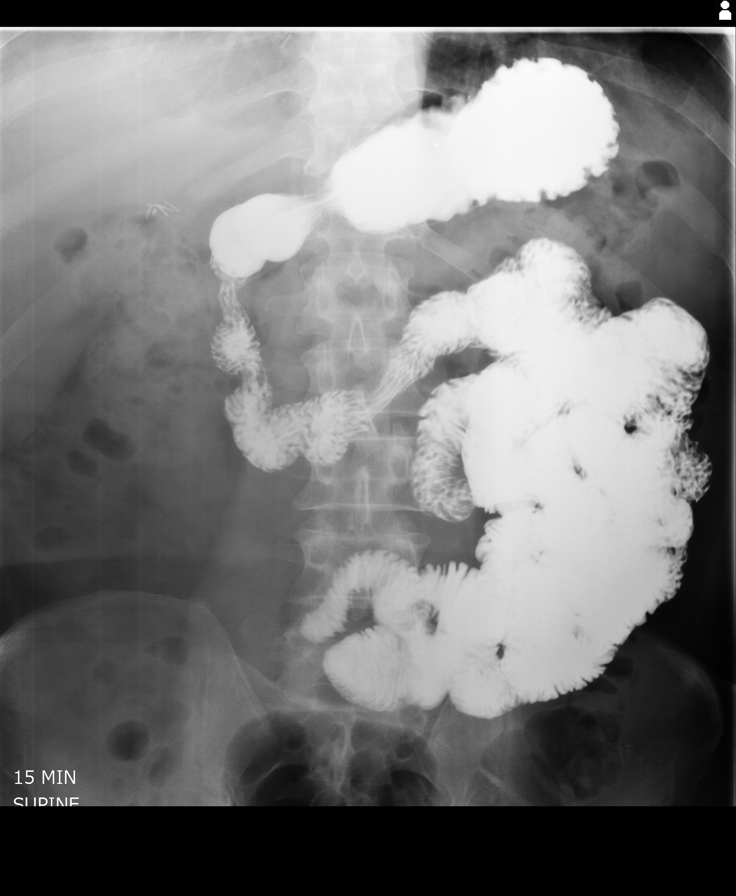
[im 2/2]
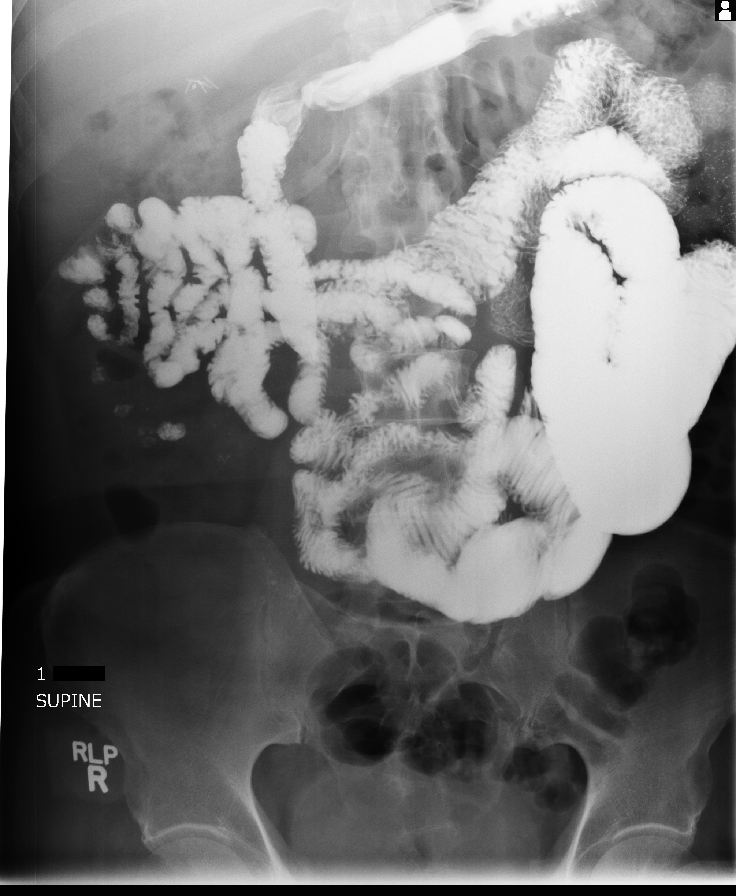

[2 of 2 positions shown; findings below may reference images not displayed]

FINDINGS: The scout film reveals a normal gas and stool pattern. The patient
ingested barium without difficulty. The jejunum demonstrated normal
feathery mucosal pattern. The ileal loops also were normal in
caliber and exhibited a normal mucosal pattern. The terminal ileum
was visualized with some difficulty due to overlap of bowel loops.
The patient was tender in the region of the terminal ileum but no
definite bowel abnormality was demonstrated.
IMPRESSION: There is tenderness in the region of the terminal ileum and cecum
without radiographic abnormality. The patient reported no tenderness
to palpation elsewhere over the abdomen. There is no evidence of a
small bowel obstruction.

## 2015-08-09 ENCOUNTER — Emergency Department: Payer: Self-pay

## 2015-08-09 ENCOUNTER — Encounter: Payer: Self-pay | Admitting: Emergency Medicine

## 2015-08-09 ENCOUNTER — Inpatient Hospital Stay
Admission: EM | Admit: 2015-08-09 | Discharge: 2015-08-10 | DRG: 389 | Disposition: A | Discharge status: Home / Self Care | Payer: Self-pay | Attending: Specialist | Admitting: Specialist

## 2015-08-09 DIAGNOSIS — K219 Gastro-esophageal reflux disease without esophagitis: Secondary | ICD-10-CM | POA: Diagnosis present

## 2015-08-09 DIAGNOSIS — K5090 Crohn's disease, unspecified, without complications: Secondary | ICD-10-CM | POA: Diagnosis present

## 2015-08-09 DIAGNOSIS — K56609 Unspecified intestinal obstruction, unspecified as to partial versus complete obstruction: Secondary | ICD-10-CM | POA: Diagnosis present

## 2015-08-09 DIAGNOSIS — Z9049 Acquired absence of other specified parts of digestive tract: Secondary | ICD-10-CM

## 2015-08-09 DIAGNOSIS — E119 Type 2 diabetes mellitus without complications: Secondary | ICD-10-CM | POA: Diagnosis present

## 2015-08-09 DIAGNOSIS — E663 Overweight: Secondary | ICD-10-CM | POA: Diagnosis present

## 2015-08-09 DIAGNOSIS — K529 Noninfective gastroenteritis and colitis, unspecified: Secondary | ICD-10-CM | POA: Insufficient documentation

## 2015-08-09 DIAGNOSIS — K5669 Other intestinal obstruction: Principal | ICD-10-CM | POA: Diagnosis present

## 2015-08-09 DIAGNOSIS — Z6826 Body mass index (BMI) 26.0-26.9, adult: Secondary | ICD-10-CM

## 2015-08-09 DIAGNOSIS — Z833 Family history of diabetes mellitus: Secondary | ICD-10-CM

## 2015-08-09 HISTORY — DX: Type 2 diabetes mellitus without complications: E11.9

## 2015-08-09 LAB — CBC
HCT: 38.6 % — ABNORMAL LOW (ref 40.0–52.0)
Hemoglobin: 12.7 g/dL — ABNORMAL LOW (ref 13.0–18.0)
MCH: 25.8 pg — AB (ref 26.0–34.0)
MCHC: 33.1 g/dL (ref 32.0–36.0)
MCV: 78 fL — ABNORMAL LOW (ref 80.0–100.0)
PLATELETS: 180 10*3/uL (ref 150–440)
RBC: 4.95 MIL/uL (ref 4.40–5.90)
RDW: 15.1 % — ABNORMAL HIGH (ref 11.5–14.5)
WBC: 11.7 10*3/uL — ABNORMAL HIGH (ref 3.8–10.6)

## 2015-08-09 LAB — COMPREHENSIVE METABOLIC PANEL
ALBUMIN: 4.6 g/dL (ref 3.5–5.0)
ALK PHOS: 68 U/L (ref 38–126)
ALT: 20 U/L (ref 17–63)
AST: 19 U/L (ref 15–41)
Anion gap: 7 (ref 5–15)
BUN: 11 mg/dL (ref 6–20)
CALCIUM: 9.3 mg/dL (ref 8.9–10.3)
CO2: 27 mmol/L (ref 22–32)
CREATININE: 0.93 mg/dL (ref 0.61–1.24)
Chloride: 103 mmol/L (ref 101–111)
GFR calc Af Amer: >60 mL/min (ref >60)
GFR calc non Af Amer: >60 mL/min (ref >60)
GLUCOSE: 184 mg/dL — AB (ref 65–99)
Potassium: 4.1 mmol/L (ref 3.5–5.1)
SODIUM: 137 mmol/L (ref 135–145)
Total Bilirubin: 0.9 mg/dL (ref 0.3–1.2)
Total Protein: 7.9 g/dL (ref 6.5–8.1)

## 2015-08-09 LAB — URINALYSIS COMPLETE WITH MICROSCOPIC (ARMC ONLY)
BILIRUBIN URINE: NEGATIVE
Bacteria, UA: NONE SEEN
Glucose, UA: NEGATIVE mg/dL
Hgb urine dipstick: NEGATIVE
Leukocytes, UA: NEGATIVE
Nitrite: NEGATIVE
PROTEIN: 30 mg/dL — AB
Specific Gravity, Urine: 1.029 (ref 1.005–1.030)
Squamous Epithelial / LPF: NONE SEEN
pH: 5 (ref 5.0–8.0)

## 2015-08-09 LAB — TROPONIN I: Troponin I: <0.03 ng/mL (ref <0.031)

## 2015-08-09 LAB — HEMOGLOBIN A1C: Hgb A1c MFr Bld: 5.9 % (ref 4.0–6.0)

## 2015-08-09 LAB — SEDIMENTATION RATE: SED RATE: 12 mm/h (ref 0–20)

## 2015-08-09 LAB — LIPASE, BLOOD: Lipase: 35 U/L (ref 11–51)

## 2015-08-09 LAB — TSH: TSH: 2.029 u[IU]/mL (ref 0.350–4.500)

## 2015-08-09 LAB — C-REACTIVE PROTEIN

## 2015-08-09 MED ORDER — ONDANSETRON HCL 4 MG/2ML IJ SOLN
4.0000 mg | Freq: Four times a day (QID) | INTRAMUSCULAR | Status: DC | PRN
  Administered 2015-08-09 (×3): 4 mg via INTRAVENOUS
  Filled 2015-08-09 (×3): qty 2

## 2015-08-09 MED ORDER — ONDANSETRON HCL 4 MG/2ML IJ SOLN
4.0000 mg | Freq: Once | INTRAMUSCULAR | Status: AC | PRN
  Administered 2015-08-09: 4 mg via INTRAVENOUS
  Filled 2015-08-09: qty 2

## 2015-08-09 MED ORDER — SODIUM CHLORIDE 0.9 % IV SOLN
INTRAVENOUS | Status: DC
  Administered 2015-08-09 – 2015-08-10 (×3): via INTRAVENOUS

## 2015-08-09 MED ORDER — CIPROFLOXACIN IN D5W 400 MG/200ML IV SOLN
400.0000 mg | Freq: Two times a day (BID) | INTRAVENOUS | Status: DC
  Administered 2015-08-09 – 2015-08-10 (×3): 400 mg via INTRAVENOUS
  Filled 2015-08-09 (×5): qty 200

## 2015-08-09 MED ORDER — ACETAMINOPHEN 325 MG PO TABS: 650.0000 mg | ORAL_TABLET | Freq: Four times a day (QID) | ORAL | Status: DC | PRN

## 2015-08-09 MED ORDER — IOHEXOL 240 MG/ML SOLN
25.0000 mL | Freq: Once | INTRAMUSCULAR | Status: AC | PRN
  Administered 2015-08-09: 25 mL via ORAL

## 2015-08-09 MED ORDER — MORPHINE SULFATE (PF) 4 MG/ML IV SOLN
4.0000 mg | Freq: Once | INTRAVENOUS | Status: AC
  Administered 2015-08-09: 4 mg via INTRAVENOUS
  Filled 2015-08-09: qty 1

## 2015-08-09 MED ORDER — HEPARIN SODIUM (PORCINE) 5000 UNIT/ML IJ SOLN
5000.0000 [IU] | Freq: Three times a day (TID) | INTRAMUSCULAR | Status: DC
  Administered 2015-08-09 – 2015-08-10 (×4): 5000 [IU] via SUBCUTANEOUS
  Filled 2015-08-09 (×4): qty 1

## 2015-08-09 MED ORDER — MORPHINE SULFATE (PF) 2 MG/ML IV SOLN
INTRAVENOUS | Status: AC
  Administered 2015-08-09: 2 mg via INTRAVENOUS
  Filled 2015-08-09: qty 1

## 2015-08-09 MED ORDER — METRONIDAZOLE IN NACL 5-0.79 MG/ML-% IV SOLN
500.0000 mg | Freq: Three times a day (TID) | INTRAVENOUS | Status: DC
  Administered 2015-08-09 – 2015-08-10 (×4): 500 mg via INTRAVENOUS
  Filled 2015-08-09 (×7): qty 100

## 2015-08-09 MED ORDER — ONDANSETRON HCL 4 MG PO TABS: 4.0000 mg | ORAL_TABLET | Freq: Four times a day (QID) | ORAL | Status: DC | PRN

## 2015-08-09 MED ORDER — MORPHINE SULFATE (PF) 2 MG/ML IV SOLN
2.0000 mg | INTRAVENOUS | Status: DC | PRN
  Administered 2015-08-09 – 2015-08-10 (×4): 2 mg via INTRAVENOUS
  Filled 2015-08-09 (×3): qty 1

## 2015-08-09 MED ORDER — METHYLPREDNISOLONE SODIUM SUCC 40 MG IJ SOLR
40.0000 mg | Freq: Two times a day (BID) | INTRAMUSCULAR | Status: DC
  Administered 2015-08-09 – 2015-08-10 (×3): 40 mg via INTRAVENOUS
  Filled 2015-08-09 (×3): qty 1

## 2015-08-09 MED ORDER — IOHEXOL 300 MG/ML  SOLN
100.0000 mL | Freq: Once | INTRAMUSCULAR | Status: AC | PRN
  Administered 2015-08-09: 100 mL via INTRAVENOUS

## 2015-08-09 MED ORDER — MORPHINE SULFATE (PF) 2 MG/ML IV SOLN
2.0000 mg | Freq: Once | INTRAVENOUS | Status: AC
  Administered 2015-08-09: 2 mg via INTRAVENOUS
  Filled 2015-08-09: qty 1

## 2015-08-09 MED ORDER — SODIUM CHLORIDE 0.9 % IV BOLUS (SEPSIS)
1000.0000 mL | Freq: Once | INTRAVENOUS | Status: AC
  Administered 2015-08-09: 1000 mL via INTRAVENOUS

## 2015-08-09 MED ORDER — ONDANSETRON HCL 4 MG/2ML IJ SOLN
INTRAMUSCULAR | Status: AC
  Administered 2015-08-09: 4 mg via INTRAVENOUS
  Filled 2015-08-09: qty 2

## 2015-08-09 MED ORDER — ACETAMINOPHEN 650 MG RE SUPP: 650.0000 mg | Freq: Four times a day (QID) | RECTAL | Status: DC | PRN

## 2015-08-09 NOTE — ED Provider Notes (Signed)
[facility] Emergency Department Provider Note  ____________________________________________  Time seen: Approximately 2:08 AM  I have reviewed the triage vital signs and the nursing notes.   HISTORY  Chief Complaint Abdominal Pain and Emesis    HPI Timothy Hancock is a 50 y.o. male history of diet controlled diabetes who presents for evaluation of one day periumbilical and left lower quadrant pain, recurrent nonbloody nonbilious emesis and recurrent bloody diarrhea as well as subjective fever, gradual onset, since onset, currently severe, no modifying factors. The patient's daughter was recently ill with similar symptoms. No chest pain difficulty breathing. No cough, no dysuria.   Past Medical History  Diagnosis Date  . Diabetes (HCC)     There are no active problems to display for this patient.   Past Surgical History  Procedure Laterality Date  . Appendectomy    . Cholecystectomy      Current Outpatient Rx  Name  Route  Sig  Dispense  Refill  . omeprazole (PRILOSEC) 20 MG capsule   Oral   Take 20 mg by mouth 2 (two) times daily before a meal.           Allergies Review of patient's allergies indicates no known allergies.  History reviewed. No pertinent family history.  Social History Social History  Substance Use Topics  . Smoking status: Never Smoker   . Smokeless tobacco: None  . Alcohol Use: No    Review of Systems Constitutional: No fever/chills Eyes: No visual changes. ENT: No sore throat. Cardiovascular: Denies chest pain. Respiratory: Denies shortness of breath. Gastrointestinal: + abdominal pain.  + nausea, + vomiting.  + diarrhea.  No constipation. Genitourinary: Negative for dysuria. Musculoskeletal: Negative for back pain. Skin: Negative for rash. Neurological: Negative for headaches, focal weakness or numbness.  10-point ROS otherwise negative.  ____________________________________________   PHYSICAL  EXAM:  VITAL SIGNS: ED Triage Vitals  Enc Vitals Group     BP 08/09/15 0040 137/77 mmHg     Pulse Rate 08/09/15 0040 102     Resp 08/09/15 0040 18     Temp 08/09/15 0040 98 F (36.7 C)     Temp Source 08/09/15 0040 Oral     SpO2 08/09/15 0040 100 %     Weight 08/09/15 0040 192 lb (87.091 kg)     Height 08/09/15 0040  (1.803 m)     Head Cir --      Peak Flow --      Pain Score 08/09/15 0041 10     Pain Loc --      Pain Edu? --      Excl. in GC? --     Constitutional: Alert and oriented. Nontoxic appearing but in mild intermittent distress due to pain. Eyes: Conjunctivae are normal. PERRL. EOMI. Head: Atraumatic. Nose: No congestion/rhinnorhea. Mouth/Throat: Mucous membranes are moist.  Oropharynx non-erythematous. Neck: No stridor.   Cardiovascular: Normal rate, regular rhythm. Grossly normal heart sounds.  Good peripheral circulation. Respiratory: Normal respiratory effort.  No retractions. Lungs CTAB. Gastrointestinal: Bowel sounds. Soft with moderate tenderness to palpation in the periumbilical region as well as the left lower quadrant. No rebound or guarding. No CVA tenderness. Genitourinary: deferred Musculoskeletal: No lower extremity tenderness nor edema.  No joint effusions. Neurologic:  Normal speech and language. No gross focal neurologic deficits are appreciated.  Skin:  Skin is warm, dry and intact. No rash noted. Psychiatric: Mood and affect are normal. Speech and behavior are normal.  ____________________________________________   LABS (  all labs ordered are listed, but only abnormal results are displayed)  Labs Reviewed  COMPREHENSIVE METABOLIC PANEL - Abnormal; Notable for the following:    Glucose, Bld 184 (*)    All other components within normal limits  CBC - Abnormal; Notable for the following:    WBC 11.7 (*)    Hemoglobin 12.7 (*)    HCT 38.6 (*)    MCV 78.0 (*)    MCH 25.8 (*)    RDW 15.1 (*)    All other components within normal limits   URINALYSIS COMPLETEWITH MICROSCOPIC ([facility] ONLY) - Abnormal; Notable for the following:    Color, Urine YELLOW (*)    APPearance CLEAR (*)    Ketones, ur TRACE (*)    Protein, ur 30 (*)    All other components within normal limits  LIPASE, BLOOD  TROPONIN I   ____________________________________________  EKG  ED ECG REPORT I, Gayla Doss, the attending physician, personally viewed and interpreted this ECG.   Date: 08/09/2015  EKG Time: 00:45  Rate: 102  Rhythm: sinus tachycardia  Axis: normal  Intervals:none  ST&T Change: No acute ST elevation  ____________________________________________  RADIOLOGY  CT abdomen and pelvis  IMPRESSION: Segmental thickening and inflammatory changes of a loop of small bowel in the mid abdomen compatible with enteritis. There is associated small-bowel obstruction with dilatation of the loops of small bowel proximally.   ____________________________________________   PROCEDURES  Procedure(s) performed: None  Critical Care performed: No  ____________________________________________   INITIAL IMPRESSION / ASSESSMENT AND PLAN / ED COURSE  Pertinent labs & imaging results that were available during my care of the patient were reviewed by me and considered in my medical decision making (see chart for details).  Timothy Hancock is a 50 y.o. male history of diet controlled diabetes who presents for evaluation of one day periumbilical and left lower quadrant pain, recurrent nonbloody nonbilious emesis and recurrent bloody diarrhea as well as subjective fever. On exam he is nontoxic appearing but is in mild distress due to pain. He does have significant tenderness in the periumbilical region as well as the left lower quadrant. Symptoms may be due to a viral syndrome given sick contact with similar symptoms however given his age and left lower quadrant pain we'll obtain CT abdomen and pelvis to evaluate for diverticulitis. We'll treat  his pain, give IV fluids, reassess for disposition. Labs reviewed and are notable for very mild leukocytosis, mild anemia, normal CMP and lipase. Negative troponin. Urinalysis is not consistent with infection.  ----------------------------------------- 4:23 AM on 08/09/2015 -----------------------------------------  CT of the abdomen and pelvis consistent with enteritis with small bowel obstruction. I discussed the case with Dr. Everlene Farrier of General Surgery who will evaluate the patient.  ----------------------------------------- 4:55 AM on 08/09/2015 ----------------------------------------- Dr. Everlene Farrier has evaluated the patient and reviewed CT scan. He recommends no acute surgical intervention. Recommends NG tube placement and feels the patient would be appropriate for medical admission. I discussed the case with Dr. Sheryle Hail for admission. NG tube ordered as the patient continues to have vomiting.  ____________________________________________   FINAL CLINICAL IMPRESSION(S) / ED DIAGNOSES  Final diagnoses:  Enteritis  SBO (small bowel obstruction) (HCC)      Gayla Doss, MD 08/09/15 [phone]

## 2015-08-09 NOTE — ED Notes (Signed)
Pt rang bell reports abdominal pain "pain level 14" Dr. Inocencio Homes made aware, received verbal orders to administered 2 mg of morphine IV.

## 2015-08-09 NOTE — ED Notes (Addendum)
Pt c/o epigastric pain for about 6 hours; N/V x 4 since started; pt took prilosec, his daughter's belladonna and gaviscon with no relief; pt last vomited 1 hour pta

## 2015-08-09 NOTE — H&P (Signed)
Timothy Hancock is an 50 y.o. male.   Chief Complaint: Nausea and vomiting HPI: The patient presents to the emergency department complaining of nausea and vomiting that began after developing abdominal pain this afternoon. The patient states that his pain was periumbilical but soon resolved. However he has been unable to keep any food down since this evening. The patient has had an episode like this in the past that resolved after 2 days of gastric decompression. He was seen in the emergency department by the surgery service who recommended conservative treatment. He has a non-operative small bowel obstruction. Also patient was given some antibiotic medication as well as fluid the hospitalist service was called for medical management.  Past Medical History  Diagnosis Date  . Diabetes [facility])     Past Surgical History  Procedure Laterality Date  . Appendectomy    . Cholecystectomy      Family History  Problem Relation Age of Onset  . Diabetes Mellitus II Mother   . Diabetes Mellitus II Father    Social History:  reports that he has never smoked. He does not have any smokeless tobacco history on file. He reports that he does not drink alcohol. His drug history is not on file.  Allergies: No Known Allergies  Prior to Admission medications   Medication Sig Start Date End Date Taking? Authorizing Provider  omeprazole (PRILOSEC) 20 MG capsule Take 20 mg by mouth 2 (two) times daily as needed (acid reflux). Reported on 08/09/2015   Yes Historical Provider, MD     Results for orders placed or performed during the hospital encounter of 08/09/15 (from the past 48 hour(s))  Lipase, blood     Status: None   Collection Time: 08/09/15  1:05 AM  Result Value Ref Range   Lipase 35 11 - 51 U/L  Comprehensive metabolic panel     Status: Abnormal   Collection Time: 08/09/15  1:05 AM  Result Value Ref Range   Sodium 137 135 - 145 mmol/L   Potassium 4.1 3.5 - 5.1 mmol/L   Chloride 103 101 - 111  mmol/L   CO2 27 22 - 32 mmol/L   Glucose, Bld 184 (H) 65 - 99 mg/dL   BUN 11 6 - 20 mg/dL   Creatinine, Ser 0.93 0.61 - 1.24 mg/dL   Calcium 9.3 8.9 - 10.3 mg/dL   Total Protein 7.9 6.5 - 8.1 g/dL   Albumin 4.6 3.5 - 5.0 g/dL   AST 19 15 - 41 U/L   ALT 20 17 - 63 U/L   Alkaline Phosphatase 68 38 - 126 U/L   Total Bilirubin 0.9 0.3 - 1.2 mg/dL   GFR calc non Af Amer >60 >60 mL/min   GFR calc Af Amer >60 >60 mL/min    Comment: (NOTE) The eGFR has been calculated using the CKD EPI equation. This calculation has not been validated in all clinical situations. eGFR's persistently <60 mL/min signify possible Chronic Kidney Disease.    Anion gap 7 5 - 15  CBC     Status: Abnormal   Collection Time: 08/09/15  1:05 AM  Result Value Ref Range   WBC 11.7 (H) 3.8 - 10.6 K/uL   RBC 4.95 4.40 - 5.90 MIL/uL   Hemoglobin 12.7 (L) 13.0 - 18.0 g/dL   HCT 38.6 (L) 40.0 - 52.0 %   MCV 78.0 (L) 80.0 - 100.0 fL   MCH 25.8 (L) 26.0 - 34.0 pg   MCHC 33.1 32.0 - 36.0 g/dL  RDW 15.1 (H) 11.5 - 14.5 %   Platelets 180 150 - 440 K/uL  Urinalysis complete, with microscopic ([facility] only)     Status: Abnormal   Collection Time: 08/09/15  1:05 AM  Result Value Ref Range   Color, Urine YELLOW (A) YELLOW   APPearance CLEAR (A) CLEAR   Glucose, UA NEGATIVE NEGATIVE mg/dL   Bilirubin Urine NEGATIVE NEGATIVE   Ketones, ur TRACE (A) NEGATIVE mg/dL   Specific Gravity, Urine 1.029 1.005 - 1.030   Hgb urine dipstick NEGATIVE NEGATIVE   pH 5.0 5.0 - 8.0   Protein, ur 30 (A) NEGATIVE mg/dL   Nitrite NEGATIVE NEGATIVE   Leukocytes, UA NEGATIVE NEGATIVE   RBC / HPF 0-5 0 - 5 RBC/hpf   WBC, UA 0-5 0 - 5 WBC/hpf   Bacteria, UA NONE SEEN NONE SEEN   Squamous Epithelial / LPF NONE SEEN NONE SEEN   Mucous PRESENT   Troponin I     Status: None   Collection Time: 08/09/15  1:05 AM  Result Value Ref Range   Troponin I <0.03 <0.031 ng/mL    Comment:        NO INDICATION OF MYOCARDIAL INJURY.    Ct Abdomen Pelvis  W Contrast  08/09/2015  CLINICAL DATA:  50 year old male with left lower quadrant abdominal pain, nausea and vomiting. EXAM: CT ABDOMEN AND PELVIS WITH CONTRAST TECHNIQUE: Multidetector CT imaging of the abdomen and pelvis was performed using the standard protocol following bolus administration of intravenous contrast. CONTRAST:  116m OMNIPAQUE IOHEXOL 300 MG/ML  SOLN COMPARISON:  Abdominal CT dated 12/27/2013 FINDINGS: The visualized lung bases are clear. No intra-abdominal free air. Small perihepatic free fluid as well as small amount of free fluid within the pelvis. Cholecystectomy. The liver, pancreas, spleen, adrenal glands, kidneys, visualized ureters, and urinary bladder appear unremarkable. There is mild enlargement of the prostate gland. There is segmental inflammatory changes and thickening of a loop of bowel in the anterior mid abdomen compatible with enteritis. There is resulting luminal narrowing with a degree of small-bowel obstruction with mild dilatation of the bowel loops proximally. There is apparent diffuse thickening of the colon, likely related to underdistention. Appendectomy. Mild aortoiliac atherosclerotic disease. The abdominal aorta and IVC appears patent. No portal venous gas identified. There is no adenopathy. There is a small fat containing umbilical hernia. The abdominal wall soft tissues are otherwise unremarkable. The osseous structures are intact. IMPRESSION: Segmental thickening and inflammatory changes of a loop of small bowel in the mid abdomen compatible with enteritis. There is associated small-bowel obstruction with dilatation of the loops of small bowel proximally. Electronically Signed   By: AAnner CreteM.D.   On: 08/09/2015 04:04    Review of Systems  Constitutional: Negative for fever and chills.  HENT: Negative for sore throat and tinnitus.   Eyes: Negative for blurred vision and redness.  Respiratory: Negative for cough and shortness of breath.    Cardiovascular: Negative for chest pain, palpitations, orthopnea and PND.  Gastrointestinal: Positive for nausea, vomiting and abdominal pain (resolved). Negative for diarrhea.  Genitourinary: Negative for dysuria, urgency and frequency.  Musculoskeletal: Negative for myalgias and joint pain.  Skin: Negative for rash.       No lesions  Neurological: Negative for speech change, focal weakness and weakness.  Endo/Heme/Allergies: Does not bruise/bleed easily.       No temperature intolerance  Psychiatric/Behavioral: Negative for depression and suicidal ideas.    Blood pressure 120/76, pulse 78, temperature 98.1 F (36.7  C), temperature source Oral, resp. rate 21, height '5\' 11"'  (1.803 m), weight 87.091 kg (192 lb), SpO2 98 %. Physical Exam  Constitutional: He is oriented to person, place, and time. He appears well-developed and well-nourished. No distress.  HENT:  Head: Normocephalic and atraumatic.  Mouth/Throat: Oropharynx is clear and moist.  Eyes: Conjunctivae and EOM are normal. Pupils are equal, round, and reactive to light. No scleral icterus.  Neck: Normal range of motion. Neck supple. No JVD present. No tracheal deviation present. No thyromegaly present.  Cardiovascular: Normal rate, regular rhythm and normal heart sounds.  Exam reveals no gallop and no friction rub.   No murmur heard. Respiratory: Effort normal and breath sounds normal. No respiratory distress.  GI: Soft. Bowel sounds are normal. He exhibits distension. He exhibits no mass. There is tenderness. There is no rebound and no guarding.  Genitourinary:  Deferred  Musculoskeletal: Normal range of motion. He exhibits no edema.  Lymphadenopathy:    He has no cervical adenopathy.  Neurological: He is alert and oriented to person, place, and time. No cranial nerve deficit.  Skin: Skin is warm and dry. No rash noted. No erythema.  Psychiatric: He has a normal mood and affect. His behavior is normal. Judgment and thought  content normal.     Assessment/Plan This is a 50 year old male admitted for small bowel obstruction. 1. Small bowel obstruction: Nonsurgical. Areas of enteritis seen on CT scan. The patient denies blood in his stool. We will hydrate him and continue nasogastric decompression. Surgery service to follow. 2. Diabetes mellitus type 2: Diet controlled. Check hemoglobin A1c. Initiate sliding scale later if we must place the patient on glucose containing intravenous fluid due to his nothing by mouth status. 3. Leukocytosis: Unspecified; likely related to enteritis. The patient is afebrile. He does not meet sepsis criteria. 4. Overweight: The patient's BMI is 26.8; encouraged healthy diet and exercise particularly to decrease glucose intolerance 5. DVT prophylaxis: Heparin 6. GI prophylaxis: None The patient is a full code. Time spent on admission orders and patient care approximately 45 minutes  Harrie Foreman, MD 08/09/2015, 6:17 AM

## 2015-08-09 NOTE — Consult Note (Signed)
Patient seen by me 2 years ago for similar problem.  He has small bowel loops with enteritis which is likely Crohn's disease, can't rule out infection.  See complete note per PA Harmon Dun.  His abd at this time is flat, soft, no palpable mass, non tender.  Will follow with you.

## 2015-08-09 NOTE — ED Notes (Signed)
Administered medication per Dr. Inocencio Homes orders.

## 2015-08-09 NOTE — Progress Notes (Signed)
[facility] - [place] at [facility]   PATIENT NAME: Timothy Hancock    MR#:  [id number]  DATE OF BIRTH:  1965-08-13  SUBJECTIVE:   Patient is here due to abdominal pain, nausea and vomiting. CT scan is suggestive of enteritis and early partial small bowel obstruction. Patient is status post NG tube decompression. Clinically feels like his abdominal pain is improved. No vomiting presently.  REVIEW OF SYSTEMS:    Review of Systems  Constitutional: Negative for fever and chills.  HENT: Negative for congestion and tinnitus.   Eyes: Negative for blurred vision and double vision.  Respiratory: Negative for cough, shortness of breath and wheezing.   Cardiovascular: Negative for chest pain, orthopnea and PND.  Gastrointestinal: Positive for nausea, vomiting and abdominal pain. Negative for diarrhea.  Genitourinary: Negative for dysuria and hematuria.  Neurological: Negative for dizziness, sensory change and focal weakness.  All other systems reviewed and are negative.   Nutrition: Nothing by mouth Tolerating Diet: No Tolerating PT: Ambulatory   DRUG ALLERGIES:  No Known Allergies  VITALS:  Blood pressure 119/67, pulse 97, temperature 98.2 F (36.8 C), temperature source Oral, resp. rate 16, height 5\' 11"  (1.803 m), weight 87.091 kg (192 lb), SpO2 98 %.  PHYSICAL EXAMINATION:   Physical Exam  GENERAL:  50 y.o.-year-old patient lying in the bed with no acute distress.  EYES: Pupils equal, round, reactive to light and accommodation. No scleral icterus. Extraocular muscles intact.  HEENT: Head atraumatic, normocephalic. Oropharynx and nasopharynx clear. NG tube in place to intermittent suction.   NECK:  Supple, no jugular venous distention. No thyroid enlargement, no tenderness.  LUNGS: Normal breath sounds bilaterally, no wheezing, rales, rhonchi. No use of accessory muscles of respiration.  CARDIOVASCULAR: S1, S2 normal. No murmurs, rubs, or gallops.   ABDOMEN: Soft, Tender in the epigastric area, no rebound, rigidity,  nondistended. Bowel sounds present. No organomegaly or mass.  EXTREMITIES: No cyanosis, clubbing or edema b/l.    NEUROLOGIC: Cranial nerves II through XII are intact. No focal Motor or sensory deficits b/l.   PSYCHIATRIC: The patient is alert and oriented x 3. Good affect.  SKIN: No obvious rash, lesion, or ulcer.    LABORATORY PANEL:   CBC  Recent Labs Lab 08/09/15 0105  WBC 11.7*  HGB 12.7*  HCT 38.6*  PLT 180   ------------------------------------------------------------------------------------------------------------------  Chemistries   Recent Labs Lab 08/09/15 0105  NA 137  K 4.1  CL 103  CO2 27  GLUCOSE 184*  BUN 11  CREATININE 0.93  CALCIUM 9.3  AST 19  ALT 20  ALKPHOS 68  BILITOT 0.9   ------------------------------------------------------------------------------------------------------------------  Cardiac Enzymes  Recent Labs Lab 08/09/15 0105  TROPONINI <0.03   ------------------------------------------------------------------------------------------------------------------  RADIOLOGY:  Dg Abd 1 View  08/09/2015  CLINICAL DATA:  Nasogastric tube placement.  Initial encounter. EXAM: ABDOMEN - 1 VIEW COMPARISON:  CT of the abdomen pelvis performed earlier today at 3:42 a.m. FINDINGS: The patient's enteric tube is noted ending overlying the distal esophagus. This could be advanced at least 15 cm. Contrast is noted within the stomach. Contrast is also seen at the renal calyces. Contrast also progresses into the small bowel, which remains somewhat distended. No free intra-abdominal air is seen, though evaluation for free air is limited on a single supine view. No acute osseous abnormalities are seen. IMPRESSION: Enteric tube noted ending overlying the distal esophagus. This could be advanced at least 15 cm. Electronically Signed   By: Roanna Raider  M.D.   On: 08/09/2015 06:40   Ct  Abdomen Pelvis W Contrast  08/09/2015  CLINICAL DATA:  50 year old male with left lower quadrant abdominal pain, nausea and vomiting. EXAM: CT ABDOMEN AND PELVIS WITH CONTRAST TECHNIQUE: Multidetector CT imaging of the abdomen and pelvis was performed using the standard protocol following bolus administration of intravenous contrast. CONTRAST:  OMNIPAQUE IOHEXOL 300 MG/ML  SOLN COMPARISON:  Abdominal CT dated 12/27/2013 FINDINGS: The visualized lung bases are clear. No intra-abdominal free air. Small perihepatic free fluid as well as small amount of free fluid within the pelvis. Cholecystectomy. The liver, pancreas, spleen, adrenal glands, kidneys, visualized ureters, and urinary bladder appear unremarkable. There is mild enlargement of the prostate gland. There is segmental inflammatory changes and thickening of a loop of bowel in the anterior mid abdomen compatible with enteritis. There is resulting luminal narrowing with a degree of small-bowel obstruction with mild dilatation of the bowel loops proximally. There is apparent diffuse thickening of the colon, likely related to underdistention. Appendectomy. Mild aortoiliac atherosclerotic disease. The abdominal aorta and IVC appears patent. No portal venous gas identified. There is no adenopathy. There is a small fat containing umbilical hernia. The abdominal wall soft tissues are otherwise unremarkable. The osseous structures are intact. IMPRESSION: Segmental thickening and inflammatory changes of a loop of small bowel in the mid abdomen compatible with enteritis. There is associated small-bowel obstruction with dilatation of the loops of small bowel proximally. Electronically Signed   By: Elgie Collard M.D.   On: 08/09/2015 04:04     ASSESSMENT AND PLAN:   50 year old male with past history of GERD, diet-controlled diabetes who presented to the hospital with abdominal pain nausea vomiting and noted to have CT scan findings suggestive of enteritis  with early small bowel obstruction.  #1 small bowel obstruction-patient was seen by surgery and he does not need any acute surgical intervention. -Continue supportive care with IV fluids, antiemetics, pain control, NG tube decompression. -We'll repeat KUB in the morning.  #2 abdominal pain/enteritis-patient has similar episode like this 2 years ago and was thought to have Crohn's disease. He has not followed up with a gastroenterologist since then. -I will consult gastroenterology and continue care as per them. -Continue supportive care with pain medications, IV fluids, antiemetics, IV antibiotics with ciprofloxacin, Flagyl, IV steroids for now.  #3 diabetes type 2 without complication-diet controlled and will monitor.  BS stable.    All the records are reviewed and case discussed with Care Management/Social Workerr. Management plans discussed with the patient, family and they are in agreement.  CODE STATUS: Full  DVT Prophylaxis: Heparin SQ  TOTAL TIME TAKING CARE OF THIS PATIENT: 30 minutes.   POSSIBLE D/C IN 1-2 DAYS, DEPENDING ON CLINICAL CONDITION.   Houston Siren M.D on 08/09/2015 at 1:21 PM  Between 7am to 6pm - Pager - [phone]  After 6pm go to [email or web address] - password [email or web address] [facility]  [place] [facility]  Office  [phone]  CC: Primary care physician; No PCP Per Patient

## 2015-08-09 NOTE — Consult Note (Addendum)
Patient ID: Timothy Hancock, male   DOB: 1965/09/24, 50 y.o.   MRN: [id number]  HPI Timothy Hancock is a 50 y.o. male   HPI 50 year old male consulted for enteritis. He reports having an acute onset of periumbilical pain last night. Pain is sharp and severe intermittent. He did have arteries. Nausea and vomiting. Nonbilious, asked to see with at least 3 watery bowel movements. Of note the wife had a similar episode 2 days ago. And he also reports that about 2-3 years ago he had similar symptoms. He does have a history of laparoscopic cholecystectomy and an appendectomy in the past. No fevers or chills no shortness of breath or any other constitutional symptoms. Pain is relieved by narcotics given in the ER. He does have good cardiovascular performance. He did have a similar episode 2 years ago that resolved without any surgical intervention. He is diabetic that is being controlled with diet alone.  Past Medical History  Diagnosis Date  . Diabetes [facility])     Past Surgical History  Procedure Laterality Date  . Appendectomy    . Cholecystectomy      History reviewed. No pertinent family history.  Social History Social History  Substance Use Topics  . Smoking status: Never Smoker   . Smokeless tobacco: None  . Alcohol Use: No    No Known Allergies  No current facility-administered medications for this encounter.   Current Outpatient Prescriptions  Medication Sig Dispense Refill  . omeprazole (PRILOSEC) 20 MG capsule Take 20 mg by mouth 2 (two) times daily before a meal.       Review of Systems A 10 point review of systems was asked and was negative except for the information on the HPI  Physical Exam Blood pressure 128/82, pulse 65, temperature 98.1 F (36.7 C), temperature source Oral, resp. rate 23, height 5\' 11"  (1.803 m), weight 87.091 kg (192 lb), SpO2 100 %. CONSTITUTIONAL: No acute distress awake alert EYES: Pupils are equal, round, and reactive to light, Sclera  are non-icteric. EARS, NOSE, MOUTH AND THROAT: The oropharynx is clear. The oral mucosa is pink and moist. Hearing is intact to voice. LYMPH NODES:  Lymph nodes in the neck are normal. RESPIRATORY:  Lungs are clear. There is normal respiratory effort, with equal breath sounds bilaterally, and without pathologic use of accessory muscles. CARDIOVASCULAR: Heart is regular without murmurs, gallops, or rubs. GI: The abdomen is  soft, mildly  Tender periumbilical region. Mild distention. Laparoscopic scars without any hernias There are no palpable masses. There is no hepatosplenomegaly.hyperactive bowel sounds  GU: Rectal deferred.   MUSCULOSKELETAL: Normal muscle strength and tone. No cyanosis or edema.   SKIN: Turgor is good and there are no pathologic skin lesions or ulcers. NEUROLOGIC: Motor and sensation is grossly normal. Cranial nerves are grossly intact. PSYCH:  Oriented to person, place and time. Affect is normal.  Data Reviewed CT scan showing evidence of thickening of the small bowel with inflammatory response ,  distal decompression and proximal mild dilation of the small bowel. No definitive transition zone.There is no evidence of pneumatosis, no evidence of free air there is no evidence of an internal hernia. Bowel is viable I have personally reviewed the patient's imaging, laboratory findings and medical records.    Assessment/Plan 50 year old with signs and symptoms of enteritis producing partial small bowel obstruction/ileus. At this point no need for any surgical intervention. We'll recommend NG tube, nothing by mouth and aggressive hydration. Also recommend serial abdominal exams with another  x-ray in the morning. Another differential diagnosis would be Crohn's enteritis, I do think that it might be a good idea to involve GI for further workup. He did have a similar episode 2 years ago that resolved without surgical intervention. We will continue to follow along. Extensive counseling  provided to the patient. Time spent with the patient was , with more than 50% of the time spent in face-to-face education, counseling and care coordination.     Sterling Big, MD FACS General Surgeon 08/09/2015, 4:59 AM

## 2015-08-09 NOTE — Consult Note (Signed)
GI Inpatient Consult Note  Reason for Consult: Enteritis/SBO   Attending Requesting Consult: Sheryle Hail  History of Present Illness: Timothy Hancock is a 50 y.o. male with a history of DM II admitted with a SBO.  Patient states he was driving from [facility] to [place] yesterday afternoon when he began experiencing acute onset, sharp, severe epigastric pain.  Associated symptoms included nausea, NBNB vomiting, and 2-3 watery BMs.   He was also unable to tolerate po intake.  The pain continued to occur intermittently, so he presented to the [facility] ED.  Of note, patient states he experienced a similar episode about 2-3 years ago, which resolved after NG tube decompression.  Labs demonstrated WBCs 11.7, Hgb 12.7, MCV 79.0.  CT a/p showed enteritis and partial SBO.  General surgery was consulted, and NG tube decompression with aggressive hydration was recommended.  Repeat abd xray this morning showed the NG tube was overlying the distal esophagus, and patient states it was advanced with good output after returning from the xray.  Today, Timothy Hancock reports his abdominal pain and nausea are much improved.  He continues to note a dull epigastric discomfort, but mentions he could not touch the area yesterday w/o severe pain.  He gently pushes on his abdomen during our visit w/o pain.  No further loose stools today.  Patient states he was in his usual state of health when symptoms began.  He has not experienced similar pain since the episode a few years ago.  His weight and appetite are stable.  No trouble with BMs, rectal bleeding, or melena per patient.  His prior abdominal surgeries include an appendectomy and cholecystectomy as noted in his chart.  Past Medical History:  Past Medical History  Diagnosis Date  . Diabetes [facility])     Problem List: Patient Active Problem List   Diagnosis Date Noted  . Enteritis   . SBO (small bowel obstruction) (HCC)     Past Surgical History: Past Surgical  History  Procedure Laterality Date  . Appendectomy    . Cholecystectomy      Allergies: No Known Allergies  Home Medications: Prescriptions prior to admission  Medication Sig Dispense Refill Last Dose  . omeprazole (PRILOSEC) 20 MG capsule Take 20 mg by mouth 2 (two) times daily as needed (acid reflux). Reported on 08/09/2015   prn at prn   Home medication reconciliation was completed with the patient.   Scheduled Inpatient Medications:   . heparin  5,000 Units Subcutaneous 3 times per day    Continuous Inpatient Infusions:   . sodium chloride 125 mL/hr at 08/09/15 0904    PRN Inpatient Medications:  acetaminophen **OR** acetaminophen, morphine injection, ondansetron **OR** ondansetron (ZOFRAN) IV  Family History: family history includes Diabetes Mellitus II in his father and mother.    Social History:   reports that he has never smoked. He does not have any smokeless tobacco history on file. He reports that he does not drink alcohol.   Review of Systems: Constitutional: Weight is stable.  Eyes: No changes in vision. ENT: No oral lesions, sore throat.  GI: see HPI.  Heme/Lymph: No easy bruising.  CV: No chest pain.  GU: No hematuria.  Integumentary: No rashes.  Neuro: No headaches.  Psych: No depression/anxiety.  Endocrine: No heat/cold intolerance.  Allergic/Immunologic: No urticaria.  Resp: No cough, SOB.  Musculoskeletal: No joint swelling.    Physical Examination: BP 119/67 mmHg  Pulse 97  Temp(Src) 98.2 F (36.8 C) (Oral)  Resp 16  Ht 5\' 11"  (1.803 m)  Wt 87.091 kg (192 lb)  BMI 26.79 kg/m2  SpO2 98% Gen: NAD, alert and oriented x 4 HEENT: PEERLA, EOMI, Neck: supple, no JVD or thyromegaly Chest: CTA bilaterally, no wheezes, crackles, or other adventitious sounds CV: RRR, no m/g/c/r Abd: soft, mild epigastric and periumbilical tenderness to moderate palpation, ND, +BS in all four quadrants; no HSM, guarding, ridigity, or rebound tenderness Ext: no  edema, well perfused with 2+ pulses, Skin: no rash or lesions noted Lymph: no LAD  Data: Lab Results  Component Value Date   WBC 11.7* 08/09/2015   HGB 12.7* 08/09/2015   HCT 38.6* 08/09/2015   MCV 78.0* 08/09/2015   PLT 180 08/09/2015    Recent Labs Lab 08/09/15 0105  HGB 12.7*   Lab Results  Component Value Date   NA 137 08/09/2015   K 4.1 08/09/2015   CL 103 08/09/2015   CO2 27 08/09/2015   BUN 11 08/09/2015   CREATININE 0.93 08/09/2015   Lab Results  Component Value Date   ALT 20 08/09/2015   AST 19 08/09/2015   ALKPHOS 68 08/09/2015   BILITOT 0.9 08/09/2015   No results for input(s): APTT, INR, PTT in the last 168 hours.   Assessment/Plan: Timothy Hancock is a 50 y.o. male with a history of DM II admitted with a enteritis and SBO.  He is improving with NG tube decompression and symptomatic treatment.  DDx for recurrent enteritis/SBO includes Crohn's disease, infection Recommend starting IV solumedrol, IV cipro, and IV flagyl.  Will continue to follow.  Further recs per Dr. Mechele Collin.  Recommendations: - Continue NG tube decompression - Start IV Solumedrol 40mg  q 12 hrs, IV Cipro 400mg  q 12 hrs, and IV Flagyl 500mg  q 8 hrs - Continue gentle IV pain control and anti-emetics prn  Thank you for the consult. We will follow along with you. Please call with questions or concerns.  Burman Freestone, PA-C [facility] Phone: [phone] Pager: [phone]

## 2015-08-09 NOTE — ED Notes (Signed)
Daughter Randell Loop has gone home at this time.

## 2015-08-10 ENCOUNTER — Inpatient Hospital Stay: Payer: Self-pay

## 2015-08-10 DIAGNOSIS — K529 Noninfective gastroenteritis and colitis, unspecified: Secondary | ICD-10-CM

## 2015-08-10 DIAGNOSIS — K5669 Other intestinal obstruction: Principal | ICD-10-CM

## 2015-08-10 MED ORDER — METRONIDAZOLE 500 MG PO TABS: 500.0000 mg | ORAL_TABLET | Freq: Two times a day (BID) | ORAL | Status: DC

## 2015-08-10 MED ORDER — ENOXAPARIN SODIUM 40 MG/0.4ML ~~LOC~~ SOLN: 40.0000 mg | SUBCUTANEOUS | Status: DC

## 2015-08-10 MED ORDER — PREDNISONE 10 MG PO TABS: ORAL_TABLET | ORAL | Status: DC

## 2015-08-10 MED ORDER — CIPROFLOXACIN HCL 250 MG PO TABS: 250.0000 mg | ORAL_TABLET | Freq: Two times a day (BID) | ORAL | Status: DC

## 2015-08-10 NOTE — Progress Notes (Signed)
Pt stable. IV removed. D/c instructions given and education provided. Signed prescriptions verified and given. Pt states he understands instructions. Pt dressed and escorted out by staff. Driven home by family.  

## 2015-08-10 NOTE — Consult Note (Signed)
Patient is going home today.  His sed rate and CRP were normal.  This can occur in about 20% of IBD and more likely in small bowel disease.  He has had gall bladder and appendectomy and could have adhesions causing partial obstruction with thickening proximal to the obstruction.  Would continue the antibiotics and prednisone for about 2 weeks or so.  I asked him to call my office with update on Friday.

## 2015-08-10 NOTE — Progress Notes (Signed)
CC: Nausea and vomiting Subjective: Patient states his nausea vomiting has resolved and his abdominal pain is very minimal he is passing gas denies fevers or chills  Objective: Vital signs in last 24 hours: Temp:  [97.4 F (36.3 C)-98.6 F (37 C)] 97.8 F (36.6 C) (02/13 0900) Pulse Rate:  [66-78] 74 (02/13 0900) Resp:  [16-17] 17 (02/13 0900) BP: (109-124)/(62-77) 124/77 mmHg (02/13 0900) SpO2:  [93 %-99 %] 99 % (02/13 0900) Weight:  [193 lb 4.8 oz (87.68 kg)] 193 lb 4.8 oz (87.68 kg) (02/13 0458) Last BM Date: 08/08/15  Intake/Output from previous day: 02/12 0701 - 02/13 0700 In: 2543.7 [I.V.:2183.7; NG/GT:60; IV Piggyback:300] Out: 2100 [Urine:1450; Emesis/NG output:650] Intake/Output this shift: Total I/O In: 391.7 [I.V.:391.7] Out: 300 [Urine:300]  Physical exam:  Awake alert and oriented Soft nondistended and nontender abdomen Nontender calves No icterus no jaundice  Lab Results: CBC   Recent Labs  08/09/15 0105  WBC 11.7*  HGB 12.7*  HCT 38.6*  PLT 180   BMET  Recent Labs  08/09/15 0105  NA 137  K 4.1  CL 103  CO2 27  GLUCOSE 184*  BUN 11  CREATININE 0.93  CALCIUM 9.3   PT/INR No results for input(s): LABPROT, INR in the last 72 hours. ABG No results for input(s): PHART, HCO3 in the last 72 hours.  Invalid input(s): PCO2, PO2  Studies/Results: Dg Abd 1 View  08/09/2015  CLINICAL DATA:  Nasogastric tube placement.  Initial encounter. EXAM: ABDOMEN - 1 VIEW COMPARISON:  CT of the abdomen pelvis performed earlier today at 3:42 a.m. FINDINGS: The patient's enteric tube is noted ending overlying the distal esophagus. This could be advanced at least 15 cm. Contrast is noted within the stomach. Contrast is also seen at the renal calyces. Contrast also progresses into the small bowel, which remains somewhat distended. No free intra-abdominal air is seen, though evaluation for free air is limited on a single supine view. No acute osseous abnormalities  are seen. IMPRESSION: Enteric tube noted ending overlying the distal esophagus. This could be advanced at least 15 cm. Electronically Signed   By: Roanna Raider M.D.   On: 08/09/2015 06:40   Ct Abdomen Pelvis W Contrast  08/09/2015  CLINICAL DATA:  50 year old male with left lower quadrant abdominal pain, nausea and vomiting. EXAM: CT ABDOMEN AND PELVIS WITH CONTRAST TECHNIQUE: Multidetector CT imaging of the abdomen and pelvis was performed using the standard protocol following bolus administration of intravenous contrast. CONTRAST:  OMNIPAQUE IOHEXOL 300 MG/ML  SOLN COMPARISON:  Abdominal CT dated 12/27/2013 FINDINGS: The visualized lung bases are clear. No intra-abdominal free air. Small perihepatic free fluid as well as small amount of free fluid within the pelvis. Cholecystectomy. The liver, pancreas, spleen, adrenal glands, kidneys, visualized ureters, and urinary bladder appear unremarkable. There is mild enlargement of the prostate gland. There is segmental inflammatory changes and thickening of a loop of bowel in the anterior mid abdomen compatible with enteritis. There is resulting luminal narrowing with a degree of small-bowel obstruction with mild dilatation of the bowel loops proximally. There is apparent diffuse thickening of the colon, likely related to underdistention. Appendectomy. Mild aortoiliac atherosclerotic disease. The abdominal aorta and IVC appears patent. No portal venous gas identified. There is no adenopathy. There is a small fat containing umbilical hernia. The abdominal wall soft tissues are otherwise unremarkable. The osseous structures are intact. IMPRESSION: Segmental thickening and inflammatory changes of a loop of small bowel in the mid abdomen compatible with  enteritis. There is associated small-bowel obstruction with dilatation of the loops of small bowel proximally. Electronically Signed   By: Elgie Collard M.D.   On: 08/09/2015 04:04   Dg Abd Acute  W/chest  08/10/2015  CLINICAL DATA:  Follow-up small bowel obstruction EXAM: DG ABDOMEN ACUTE W/ 1V CHEST COMPARISON:  Abdominal flat plate of August 09, 2015 FINDINGS: The lungs are borderline hypoinflated but clear. The heart and pulmonary vascularity are normal. The esophagogastric tube tip lies in the region of the gastric cardia. There is no obstructive pattern. A small amount of small bowel gas is noted in the left mid abdomen. There is contrast within the right colon and transverse colon. There is gas and stool in the descending colon and sigmoid. There is no rectal gas. The bony structures are unremarkable. No abnormal soft tissue calcifications are observed. IMPRESSION: No evidence of persistent small bowel obstruction though minimal small bowel ileus is not excluded. Migration of orally administered CT contrast into the colon has occurred. There is no free extraluminal gas. In nasogastric tube tip lies in the region of the gastric cardia and and advancement by 10-15 cm is recommended. Electronically Signed   By: David  [place] M.D.   On: 08/10/2015 08:06    Anti-infectives: Anti-infectives    Start     Dose/Rate Route Frequency Ordered Stop   08/09/15 1315  ciprofloxacin (CIPRO) IVPB 400 mg     400 mg 200 mL/hr over 60 Minutes Intravenous Every 12 hours 08/09/15 1310     08/09/15 1315  metroNIDAZOLE (FLAGYL) IVPB 500 mg     500 mg 100 mL/hr over 60 Minutes Intravenous Every 8 hours 08/09/15 1310        Assessment/Plan:  SBO versus Crohn's disease. Patient is being seen by Dr. Mechele Collin who believes that this is Crohn's disease. His KUB is personally reviewed showing contrast moved into the colon. There is no obvious sign of obstruction. Recommend discontinuing the nasogastric tube and starting clear liquids. No surgical plans will sign off.  Lattie Haw, MD, FACS  08/10/2015

## 2015-08-11 NOTE — Discharge Summary (Signed)
[facility] - [place] at [facility]   PATIENT NAME: Timothy Hancock    MR#:  [id number]  DATE OF BIRTH:  10-09-1965  DATE OF ADMISSION:  08/09/2015 ADMITTING PHYSICIAN: Arnaldo Natal, MD  DATE OF DISCHARGE: 08/10/2015  6:23 PM  PRIMARY CARE PHYSICIAN: No PCP Per Patient    ADMISSION DIAGNOSIS:  Enteritis [K52.9] SBO (small bowel obstruction) (HCC) [K56.69]  DISCHARGE DIAGNOSIS:  Active Problems:   SBO (small bowel obstruction) (HCC) Crohn's Disease.   SECONDARY DIAGNOSIS:   Past Medical History  Diagnosis Date  . Diabetes [facility])     HOSPITAL COURSE:   50 year old male with past history of GERD, diet-controlled diabetes who presented to the hospital with abdominal pain nausea vomiting and noted to have CT scan findings suggestive of enteritis with early small bowel obstruction.  #1 small bowel obstruction-this was working diagnosis on admission given his CT scan findings and his presenting symptoms of abdominal pain nausea and vomiting. -A surgical consult was obtained and he did not think to the patient needed acute surgical intervention. An NG tube was placed and patient was maintained on supportive care with IV fluids antiemetics and pain control. -After supportive care patient's clinical symptoms improved his NG tube was discontinued and he was started on a liquid diet and advanced to a soft diet which she is not tolerating without any worsening of his abdominal pain nausea vomiting and therefore being discharged home.  #2 abdominal pain/enteritis-patient has similar episode like this 2 years ago and was thought to have Crohn's disease. He had not followed up with a gastroenterologist since then. -Patient was seen by gastroenterology during this hospitalization. He was seen by Dr. Mechele Collin. After GI evaluation they thought this could be underlying Crohn's disease with a flareup and therefore patient was started on IV Solu-Medrol, ciprofloxacin and  Flagyl.  -Since his clinical symptoms have improved and is tolerating a diet well he's being discharged on oral prednisone taper, Cipro and Flagyl for a few more days and follow up with gastroenterology as an outpatient.  #3 diabetes type 2 without complication-this is diet-controlled and his blood sugars remained stable while in the hospital.  DISCHARGE CONDITIONS:   Stable  CONSULTS OBTAINED:  Treatment Team:  Leafy Ro, MD Ricarda Frame, MD Scot Jun, MD  DRUG ALLERGIES:  No Known Allergies  DISCHARGE MEDICATIONS:   Discharge Medication List as of 08/10/2015  6:03 PM    START taking these medications   Details  ciprofloxacin (CIPRO) 250 MG tablet Take 1 tablet (250 mg total) by mouth 2 (two) times daily., Starting 08/10/2015, Until Discontinued, Print    metroNIDAZOLE (FLAGYL) 500 MG tablet Take 1 tablet (500 mg total) by mouth 2 (two) times daily., Starting 08/10/2015, Until Discontinued, Print    predniSONE (DELTASONE) 10 MG tablet Label  & dispense according to the schedule below. 5 Pills PO for 2 days then, 4 Pills PO for 2 days, 3 Pills PO for 2 days, 2 Pills PO for 2 days, 1 Pill PO for 2 days then STOP., Print      CONTINUE these medications which have NOT CHANGED   Details  omeprazole (PRILOSEC) 20 MG capsule Take 20 mg by mouth 2 (two) times daily as needed (acid reflux). Reported on 08/09/2015, Until Discontinued, Historical Med         DISCHARGE INSTRUCTIONS:   DIET:  Diabetic diet  DISCHARGE CONDITION:  Stable  ACTIVITY:  Activity as tolerated  OXYGEN:  Home Oxygen:  No.   Oxygen Delivery: room air  DISCHARGE LOCATION:  home   If you experience worsening of your admission symptoms, develop shortness of breath, life threatening emergency, suicidal or homicidal thoughts you must seek medical attention immediately by calling 911 or calling your MD immediately  if symptoms less severe.  You Must read complete instructions/literature along  with all the possible adverse reactions/side effects for all the Medicines you take and that have been prescribed to you. Take any new Medicines after you have completely understood and accpet all the possible adverse reactions/side effects.   Please note  You were cared for by a hospitalist during your hospital stay. If you have any questions about your discharge medications or the care you received while you were in the hospital after you are discharged, you can call the unit and asked to speak with the hospitalist on call if the hospitalist that took care of you is not available. Once you are discharged, your primary care physician will handle any further medical issues. Please note that NO REFILLS for any discharge medications will be authorized once you are discharged, as it is imperative that you return to your primary care physician (or establish a relationship with a primary care physician if you do not have one) for your aftercare needs so that they can reassess your need for medications and monitor your lab values.     Today   No abdominal pain, nausea, vomiting. Tolerating a soft diet well.  VITAL SIGNS:  Blood pressure 113/64, pulse 64, temperature 97.8 F (36.6 C), temperature source Oral, resp. rate 18, height 5\' 11"  (1.803 m), weight 87.68 kg (193 lb 4.8 oz), SpO2 100 %.  I/O:  No intake or output data in the 24 hours ending 08/11/15 1523  PHYSICAL EXAMINATION:  GENERAL:  50 y.o.-year-old patient lying in the bed with no acute distress.  EYES: Pupils equal, round, reactive to light and accommodation. No scleral icterus. Extraocular muscles intact.  HEENT: Head atraumatic, normocephalic. Oropharynx and nasopharynx clear.  NECK:  Supple, no jugular venous distention. No thyroid enlargement, no tenderness.  LUNGS: Normal breath sounds bilaterally, no wheezing, rales,rhonchi. No use of accessory muscles of respiration.  CARDIOVASCULAR: S1, S2 normal. No murmurs, rubs, or gallops.   ABDOMEN: Soft, non-tender, non-distended. Bowel sounds present. No organomegaly or mass.  EXTREMITIES: No pedal edema, cyanosis, or clubbing.  NEUROLOGIC: Cranial nerves II through XII are intact. No focal motor or sensory defecits b/l.  PSYCHIATRIC: The patient is alert and oriented x 3. Good affect.  SKIN: No obvious rash, lesion, or ulcer.   DATA REVIEW:   CBC  Recent Labs Lab 08/09/15 0105  WBC 11.7*  HGB 12.7*  HCT 38.6*  PLT 180    Chemistries   Recent Labs Lab 08/09/15 0105  NA 137  K 4.1  CL 103  CO2 27  GLUCOSE 184*  BUN 11  CREATININE 0.93  CALCIUM 9.3  AST 19  ALT 20  ALKPHOS 68  BILITOT 0.9    Cardiac Enzymes  Recent Labs Lab 08/09/15 0105  TROPONINI <0.03    Microbiology Results  No results found for this or any previous visit.  RADIOLOGY:  Dg Abd Acute W/chest  08/10/2015  CLINICAL DATA:  Follow-up small bowel obstruction EXAM: DG ABDOMEN ACUTE W/ 1V CHEST COMPARISON:  Abdominal flat plate of August 09, 2015 FINDINGS: The lungs are borderline hypoinflated but clear. The heart and pulmonary vascularity are normal. The esophagogastric tube tip lies in the region of  the gastric cardia. There is no obstructive pattern. A small amount of small bowel gas is noted in the left mid abdomen. There is contrast within the right colon and transverse colon. There is gas and stool in the descending colon and sigmoid. There is no rectal gas. The bony structures are unremarkable. No abnormal soft tissue calcifications are observed. IMPRESSION: No evidence of persistent small bowel obstruction though minimal small bowel ileus is not excluded. Migration of orally administered CT contrast into the colon has occurred. There is no free extraluminal gas. In nasogastric tube tip lies in the region of the gastric cardia and and advancement by 10-15 cm is recommended. Electronically Signed   By: David  [place] M.D.   On: 08/10/2015 08:06      Management plans discussed  with the patient, family and they are in agreement.  CODE STATUS:  Code Status History    Date Active Date Inactive Code Status Order ID Comments User Context   08/09/2015  8:48 AM 08/10/2015  9:24 PM Full Code [id number]  Arnaldo Natal, MD Inpatient      TOTAL TIME TAKING CARE OF THIS PATIENT: 40 minutes.    Houston Siren M.D on 08/11/2015 at 3:23 PM  Between 7am to 6pm - Pager - [phone]  After 6pm go to [email or web address] - password [email or web address] [facility]  [place] [facility]  Office  [phone]  CC: Primary care physician; No PCP Per Patient

## 2017-01-02 IMAGING — CR DG ABDOMEN ACUTE W/ 1V CHEST
1 series · 4 of 4 positions shown · non-contrast
Comparison: Abdominal flat plate August 09, 2015

CLINICAL DATA: Follow-up small bowel obstruction

EXAM:
DG ABDOMEN ACUTE W/ 1V CHEST

[Series 1: dg abd acute w/chest · 0.14mm/px · 4 of 4 slices shown]
[im 1/4]
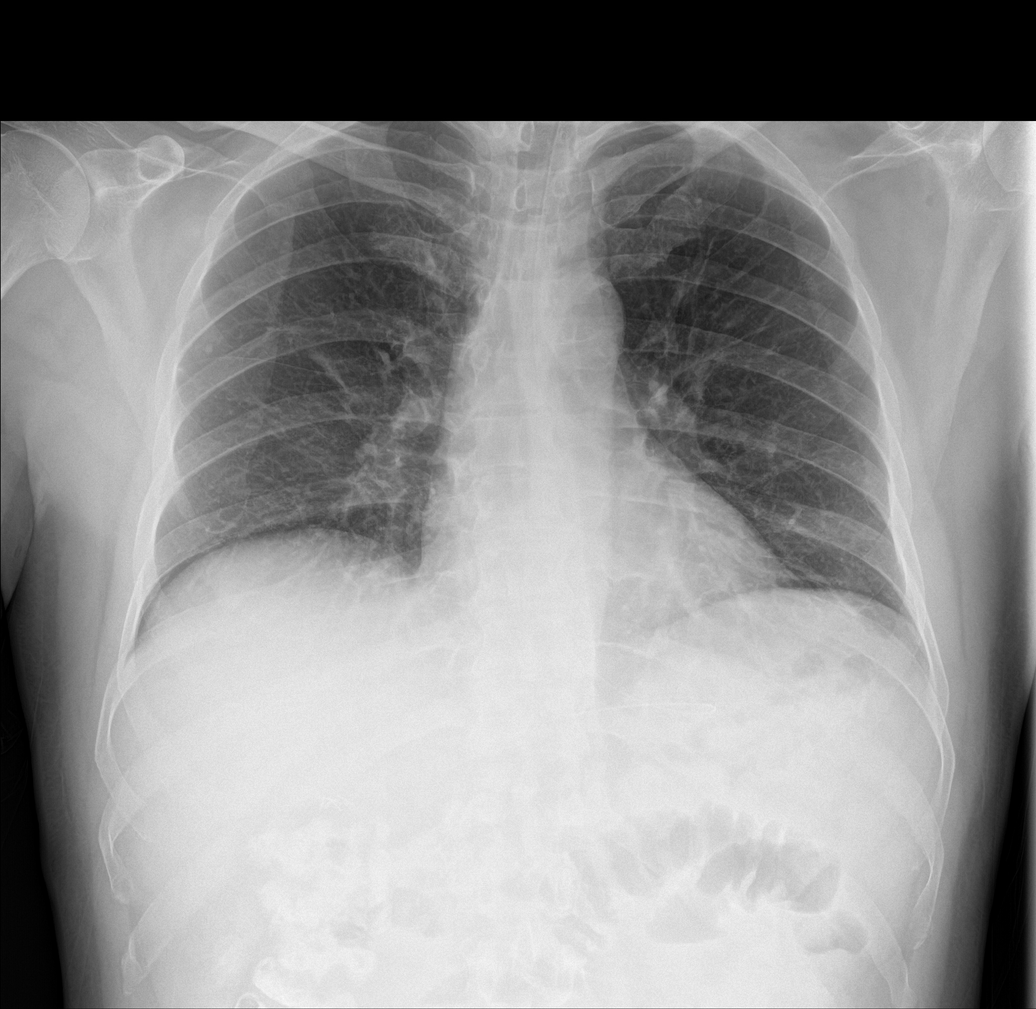
[im 2/4]
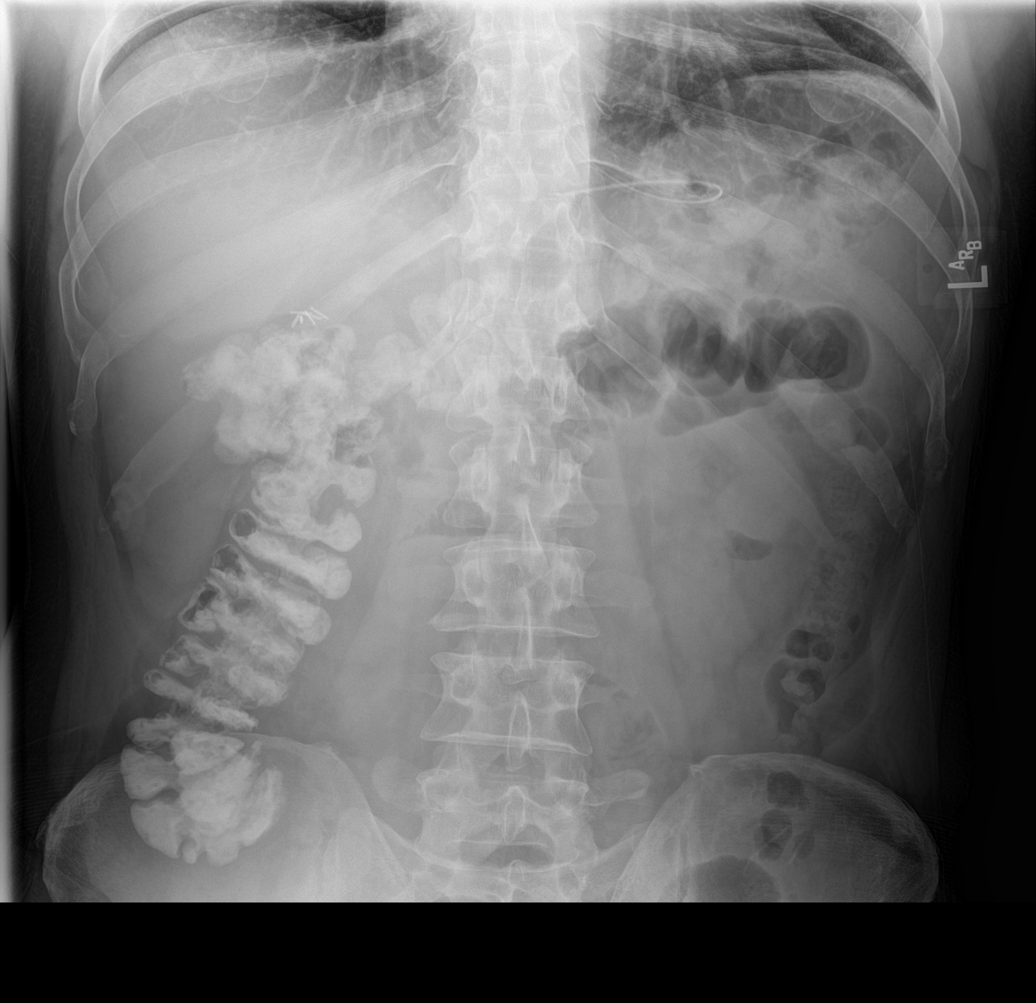
[im 3/4]
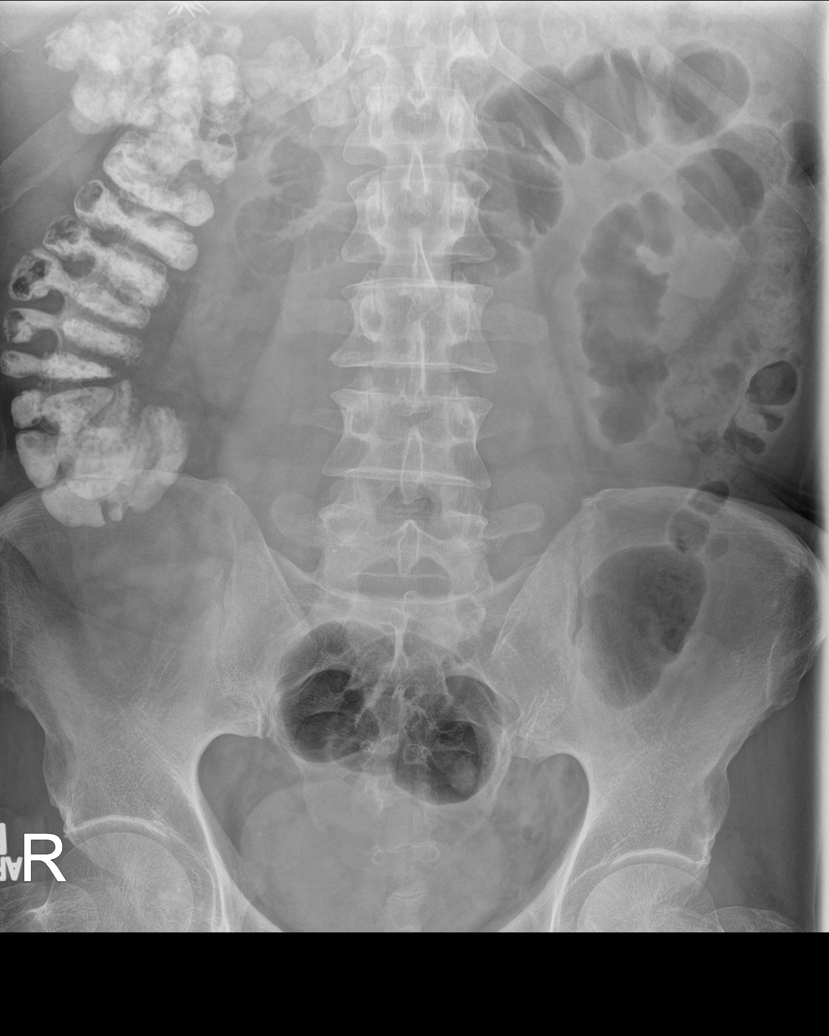
[im 4/4]
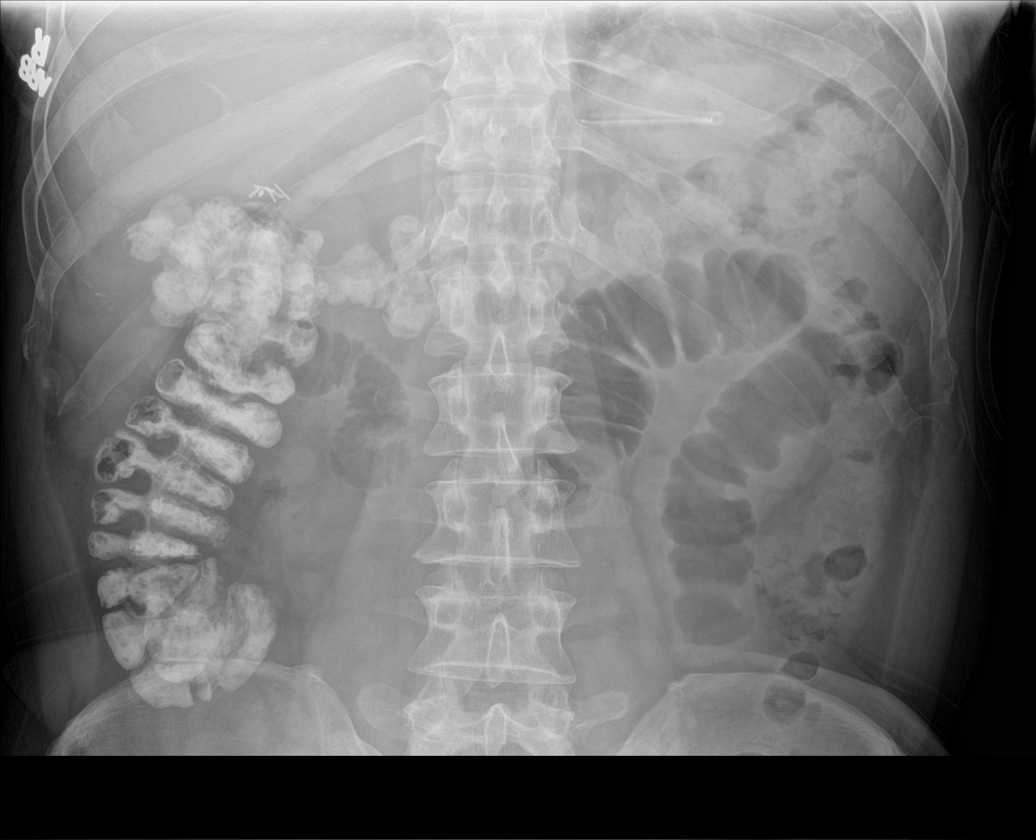

[4 of 4 positions shown; findings below may reference images not displayed]

FINDINGS: The lungs are borderline hypoinflated but clear. The heart and
pulmonary vascularity are normal. The esophagogastric tube tip lies
in the region of the gastric cardia. There is no obstructive
pattern. A small amount of small bowel gas is noted in the left mid
abdomen. There is contrast within the right colon and transverse
colon. There is gas and stool in the descending colon and sigmoid.
There is no rectal gas. The bony structures are unremarkable. No
abnormal soft tissue calcifications are observed.
IMPRESSION: No evidence of persistent small bowel obstruction though minimal
small bowel ileus is not excluded. Migration of orally administered
CT contrast into the colon has occurred. There is no free
extraluminal gas.

In nasogastric tube tip lies in the region of the gastric cardia and
and advancement by 10-15 cm is recommended.

## 2017-12-16 ENCOUNTER — Ambulatory Visit: Payer: Self-pay | Admitting: Cardiovascular Disease

## 2018-08-14 ENCOUNTER — Emergency Department
Admission: EM | Admit: 2018-08-14 | Discharge: 2018-08-14 | Disposition: A | Discharge status: Home / Self Care | Payer: Self-pay | Attending: Emergency Medicine | Admitting: Emergency Medicine

## 2018-08-14 ENCOUNTER — Other Ambulatory Visit: Payer: Self-pay

## 2018-08-14 ENCOUNTER — Encounter: Payer: Self-pay | Admitting: Emergency Medicine

## 2018-08-14 ENCOUNTER — Emergency Department: Payer: Self-pay

## 2018-08-14 DIAGNOSIS — E119 Type 2 diabetes mellitus without complications: Secondary | ICD-10-CM | POA: Insufficient documentation

## 2018-08-14 DIAGNOSIS — S22089A Unspecified fracture of T11-T12 vertebra, initial encounter for closed fracture: Secondary | ICD-10-CM | POA: Insufficient documentation

## 2018-08-14 DIAGNOSIS — Y929 Unspecified place or not applicable: Secondary | ICD-10-CM | POA: Insufficient documentation

## 2018-08-14 DIAGNOSIS — W0110XA Fall on same level from slipping, tripping and stumbling with subsequent striking against unspecified object, initial encounter: Secondary | ICD-10-CM | POA: Insufficient documentation

## 2018-08-14 DIAGNOSIS — Y999 Unspecified external cause status: Secondary | ICD-10-CM | POA: Insufficient documentation

## 2018-08-14 DIAGNOSIS — S22080A Wedge compression fracture of T11-T12 vertebra, initial encounter for closed fracture: Secondary | ICD-10-CM

## 2018-08-14 DIAGNOSIS — M5126 Other intervertebral disc displacement, lumbar region: Secondary | ICD-10-CM | POA: Insufficient documentation

## 2018-08-14 DIAGNOSIS — Y939 Activity, unspecified: Secondary | ICD-10-CM | POA: Insufficient documentation

## 2018-08-14 DIAGNOSIS — M5136 Other intervertebral disc degeneration, lumbar region: Secondary | ICD-10-CM

## 2018-08-14 MED ORDER — OXYCODONE-ACETAMINOPHEN 7.5-325 MG PO TABS: 1.0000 | ORAL_TABLET | Freq: Four times a day (QID) | ORAL | 0 refills | Status: DC | PRN

## 2018-08-14 MED ORDER — CYCLOBENZAPRINE HCL 10 MG PO TABS: 10.0000 mg | ORAL_TABLET | Freq: Three times a day (TID) | ORAL | 0 refills | Status: DC | PRN

## 2018-08-14 MED ORDER — METHYLPREDNISOLONE SODIUM SUCC 125 MG IJ SOLR
125.0000 mg | Freq: Once | INTRAMUSCULAR | Status: AC
  Administered 2018-08-14: 125 mg via INTRAMUSCULAR
  Filled 2018-08-14: qty 2

## 2018-08-14 MED ORDER — METHYLPREDNISOLONE 4 MG PO TBPK: ORAL_TABLET | ORAL | 0 refills | Status: DC

## 2018-08-14 MED ORDER — IBUPROFEN 600 MG PO TABS: 600.0000 mg | ORAL_TABLET | Freq: Three times a day (TID) | ORAL | 0 refills | Status: DC | PRN

## 2018-08-14 MED ORDER — HYDROMORPHONE HCL 1 MG/ML IJ SOLN
1.0000 mg | Freq: Once | INTRAMUSCULAR | Status: AC
  Administered 2018-08-14: 1 mg via INTRAMUSCULAR
  Filled 2018-08-14: qty 1

## 2018-08-14 MED ORDER — LIDOCAINE 5 % EX PTCH: 1.0000 | MEDICATED_PATCH | Freq: Two times a day (BID) | CUTANEOUS | 0 refills | Status: AC

## 2018-08-14 MED ORDER — LIDOCAINE 5 % EX PTCH: 1.0000 | MEDICATED_PATCH | Freq: Two times a day (BID) | CUTANEOUS | 0 refills | Status: DC

## 2018-08-14 NOTE — ED Notes (Signed)
See triage note  States he lost his balance and fell back  Having pain to back and moving into legs  States pain is worse to right leg than left  Was able to walk after fall

## 2018-08-14 NOTE — ED Triage Notes (Signed)
Says he fell about 45 min ago.  Says he stepped back and fell on back.  He says he was able to walk after, but has pressure in low to mid back.  Says he has numbness, but he is able to feel when I touch both upper and lower legs.  Says it just doesn't feel normal.

## 2018-08-14 NOTE — ED Provider Notes (Signed)
[facility] Emergency Department Provider Note   ____________________________________________   First MD Initiated Contact with Patient 08/14/18 1005     (approximate)  I have reviewed the triage vital signs and the nursing notes.   HISTORY  Chief Complaint Back Pain and Fall    HPI Timothy Hancock is a 53 y.o. male patient presents with upper low back pain secondary to a fall which occurred 1 hour prior to arrival.  Patient said he stepped and fell backwards landing on his back.  Patient states able to walk but notes increased pressure to the mid and lower back.  Patient also state there is intermittent numbness to the lower extremities.  Patient denies bladder bowel dysfunction.  Patient voided just prior to arrival.  Patient rates his pain as a 10/10.  No palliative measures prior to arrival.    Past Medical History:  Diagnosis Date  . Diabetes [facility])     Patient Active Problem List   Diagnosis Date Noted  . Enteritis   . SBO (small bowel obstruction) (HCC)     Past Surgical History:  Procedure Laterality Date  . APPENDECTOMY    . CHOLECYSTECTOMY      Prior to Admission medications   Medication Sig Start Date End Date Taking? Authorizing Provider  cyclobenzaprine (FLEXERIL) 10 MG tablet Take 1 tablet (10 mg total) by mouth 3 (three) times daily as needed. 08/14/18   Joni Reining, PA-C  ibuprofen (ADVIL,MOTRIN) 600 MG tablet Take 1 tablet (600 mg total) by mouth every 8 (eight) hours as needed. 08/14/18   Joni Reining, PA-C  lidocaine (LIDODERM) 5 % Place 1 patch onto the skin every 12 (twelve) hours. Remove & Discard patch within 12 hours or as directed by MD 08/14/18 08/14/19  Joni Reining, PA-C  oxyCODONE-acetaminophen (PERCOCET) 7.5-325 MG tablet Take 1 tablet by mouth every 6 (six) hours as needed. 08/14/18   Joni Reining, PA-C    Allergies Patient has no known allergies.  Family History  Problem Relation Age of Onset  .  Diabetes Mellitus II Mother   . Diabetes Mellitus II Father     Social History Social History   Tobacco Use  . Smoking status: Never Smoker  . Smokeless tobacco: Never Used  Substance Use Topics  . Alcohol use: No  . Drug use: Not on file    Review of Systems Constitutional: No fever/chills Eyes: No visual changes. ENT: No sore throat. Cardiovascular: Denies chest pain. Respiratory: Denies shortness of breath. Gastrointestinal: No abdominal pain.  No nausea, no vomiting.  No diarrhea.  No constipation. Genitourinary: Negative for dysuria. Musculoskeletal: Positive for back pain. Skin: Negative for rash. Neurological: Negative for headaches, focal weakness or numbness. Endocrine:  Diabetes   ____________________________________________   PHYSICAL EXAM:  VITAL SIGNS: ED Triage Vitals  Enc Vitals Group     BP 08/14/18 1000 (!) 150/101     Pulse Rate 08/14/18 1000 80     Resp 08/14/18 1000 16     Temp 08/14/18 1000 97.6 F (36.4 C)     Temp Source 08/14/18 1000 Oral     SpO2 08/14/18 1000 100 %     Weight 08/14/18 1001 190 lb (86.2 kg)     Height 08/14/18 1001 6' (1.829 m)     Head Circumference --      Peak Flow --      Pain Score 08/14/18 1000 10     Pain Loc --  Pain Edu? --      Excl. in GC? --     Constitutional: Alert and oriented. Well appearing and in no acute distress. Neck:No cervical spine tenderness to palpation. Hematological/Lymphatic/Immunilogical: No cervical lymphadenopathy. Cardiovascular: Normal rate, regular rhythm. Grossly normal heart sounds.  Good peripheral circulation.  Elevated blood pressure Respiratory: Normal respiratory effort.  No retractions. Lungs CTAB. Gastrointestinal: Soft and nontender. No distention. No abdominal bruits. No CVA tenderness. Musculoskeletal: No obvious spinal deformity.  Patient is moderate guarding palpation between T11 and L1.  Patient also has moderate guarding palpation of L3-L5.  Neurologic:  Normal  speech and language. No gross focal neurologic deficits are appreciated. No gait instability. Skin:  Skin is warm, dry and intact. No rash noted. Psychiatric: Mood and affect are normal. Speech and behavior are normal.  ____________________________________________   LABS (all labs ordered are listed, but only abnormal results are displayed)  Labs Reviewed - No data to display ____________________________________________  EKG   ____________________________________________  RADIOLOGY  ED MD interpretation:    Official radiology report(s): Ct Lumbar Spine Wo Contrast  Result Date: 08/14/2018 CLINICAL DATA:  The patient is having low back pain and difficulty moving his legs since a fall this morning. Initial encounter. EXAM: CT LUMBAR SPINE WITHOUT CONTRAST TECHNIQUE: Multidetector CT imaging of the lumbar spine was performed without intravenous contrast administration. Multiplanar CT image reconstructions were also generated. COMPARISON:  CT abdomen and pelvis 08/09/2015. FINDINGS: Segmentation: Standard. Alignment: Maintained. Vertebrae: The patient has an anterior, inferior endplate compression fracture of T12 with vertebral body height loss of 10-15%. Fracture margins are sharp consistent with acute injury. No bony retropulsion or involvement of the posterior elements is identified. No other fracture is seen. Small Schmorl's node in the superior endplate of L4 is noted. No lytic or sclerotic lesion. Paraspinal and other soft tissues: Aortic atherosclerosis noted. Disc levels: T11-12: Mild facet degenerative change. Otherwise negative. T12-L1: Mild facet degenerative change.  Otherwise negative. L1-2: Mild facet degenerative change.  Otherwise negative. L2-3: Negative. L3-4: Shallow disc bulge and ligamentum flavum thickening cause mild central canal narrowing. The foramina are open. L4-5: Shallow disc bulge.  No stenosis. L5-S1: Negative. IMPRESSION: Inferior endplate compression fracture of  T12 with vertebral body height loss of 10-15% is acute. There is no bony retropulsion or involvement of the posterior elements. Lumbar spondylosis most notable at L3-4 where a shallow disc bulge and ligamentum flavum thickening cause mild central canal narrowing. Atherosclerosis. Electronically Signed   By: Drusilla Kanner M.D.   On: 08/14/2018 10:49    ____________________________________________   PROCEDURES  Procedure(s) performed:   Procedures  Critical Care performed:   ____________________________________________   INITIAL IMPRESSION / ASSESSMENT AND PLAN / ED COURSE  As part of my medical decision making, I reviewed the following data within the electronic MEDICAL RECORD NUMBER     Back pain secondary to fall.  Differential diagnosis consists of thoracic and lumbar fracture.  CT findings revealed anterior inferior endplate compression fracture T12.  Patient also has mild disc bulge at L4-5.  No cauda equina findings on physical exam..  Discussed x-ray findings with patient.  Patient given discharge care instructions and advised take medication as directed.     ____________________________________________   FINAL CLINICAL IMPRESSION(S) / ED DIAGNOSES  Final diagnoses:  Compression fracture of T12 vertebra, initial encounter (HCC)  Bulge of lumbar disc without myelopathy     ED Discharge Orders         Ordered    lidocaine (LIDODERM)  5 %  Every 12 hours     08/14/18 1116    oxyCODONE-acetaminophen (PERCOCET) 7.5-325 MG tablet  Every 6 hours PRN     08/14/18 1116    cyclobenzaprine (FLEXERIL) 10 MG tablet  3 times daily PRN     08/14/18 1116    ibuprofen (ADVIL,MOTRIN) 600 MG tablet  Every 8 hours PRN     08/14/18 1116           Note:  This document was prepared using Dragon voice recognition software and may include unintentional dictation errors.    Joni Reining, PA-C 08/14/18 1118    Sharman Cheek, MD 08/24/18 [phone]

## 2018-09-12 ENCOUNTER — Other Ambulatory Visit: Payer: Self-pay | Admitting: Internal Medicine

## 2018-09-13 LAB — CBC WITH DIFFERENTIAL/PLATELET
Basophils Absolute: 0 10*3/uL (ref 0.0–0.2)
Basos: 1 %
EOS (ABSOLUTE): 0.1 10*3/uL (ref 0.0–0.4)
Eos: 1 %
Hematocrit: 38.9 % (ref 37.5–51.0)
Hemoglobin: 11.7 g/dL — ABNORMAL LOW (ref 13.0–17.7)
IMMATURE GRANULOCYTES: 1 %
Immature Grans (Abs): 0 10*3/uL (ref 0.0–0.1)
Lymphocytes Absolute: 1.6 10*3/uL (ref 0.7–3.1)
Lymphs: 26 %
MCH: 24.9 pg — ABNORMAL LOW (ref 26.6–33.0)
MCHC: 30.1 g/dL — ABNORMAL LOW (ref 31.5–35.7)
MCV: 83 fL (ref 79–97)
Monocytes Absolute: 0.5 10*3/uL (ref 0.1–0.9)
Monocytes: 8 %
NEUTROS PCT: 63 %
Neutrophils Absolute: 3.9 10*3/uL (ref 1.4–7.0)
Platelets: 173 10*3/uL (ref 150–450)
RBC: 4.69 x10E6/uL (ref 4.14–5.80)
RDW: 16.7 % — ABNORMAL HIGH (ref 11.6–15.4)
WBC: 6 10*3/uL (ref 3.4–10.8)

## 2018-09-13 LAB — COMPREHENSIVE METABOLIC PANEL
ALK PHOS: 108 IU/L (ref 39–117)
ALT: 33 IU/L (ref 0–44)
AST: 28 IU/L (ref 0–40)
Albumin/Globulin Ratio: 1.8 (ref 1.2–2.2)
Albumin: 4.5 g/dL (ref 3.8–4.9)
BUN / CREAT RATIO: 19 (ref 9–20)
BUN: 17 mg/dL (ref 6–24)
Bilirubin Total: 0.4 mg/dL (ref 0.0–1.2)
CO2: 24 mmol/L (ref 20–29)
Calcium: 10 mg/dL (ref 8.7–10.2)
Chloride: 99 mmol/L (ref 96–106)
Globulin, Total: 2.5 g/dL (ref 1.5–4.5)
Glucose: 299 mg/dL — ABNORMAL HIGH (ref 65–99)
Potassium: 4.3 mmol/L (ref 3.5–5.2)
Sodium: 135 mmol/L (ref 134–144)
Total Protein: 7 g/dL (ref 6.0–8.5)

## 2018-09-13 LAB — T3: T3, Total: 97 ng/dL (ref 71–180)

## 2018-09-13 LAB — HGB A1C W/O EAG: Hgb A1c MFr Bld: 9.8 % — ABNORMAL HIGH (ref 4.8–5.6)

## 2018-09-13 LAB — URINALYSIS, ROUTINE W REFLEX MICROSCOPIC
BILIRUBIN UA: NEGATIVE
Ketones, UA: NEGATIVE
LEUKOCYTES UA: NEGATIVE
Nitrite, UA: NEGATIVE
RBC, UA: NEGATIVE
Urobilinogen, Ur: 0.2 mg/dL (ref 0.2–1.0)
pH, UA: 7.5 (ref 5.0–7.5)

## 2018-09-13 LAB — KIDNEY PROFILE
Creatinine, Ser: 0.9 mg/dL (ref 0.76–1.27)
Creatinine, Urine: 60.6 mg/dL
GFR calc Af Amer: 112 mL/min/{1.73_m2} (ref >59)
GFR calc non Af Amer: 96 mL/min/{1.73_m2} (ref >59)
Microalb/Creat Ratio: 34 mg/g creat — ABNORMAL HIGH (ref 0–29)
Microalbumin, Urine: 20.8 ug/mL

## 2018-09-13 LAB — B12 AND FOLATE PANEL
Folate: 14.3 ng/mL (ref >3.0)
Vitamin B-12: 1115 pg/mL (ref 232–1245)

## 2018-09-13 LAB — VITAMIN D 25 HYDROXY (VIT D DEFICIENCY, FRACTURES): VIT D 25 HYDROXY: 21 ng/mL — AB (ref 30.0–100.0)

## 2018-09-13 LAB — T4, FREE: FREE T4: 1.18 ng/dL (ref 0.82–1.77)

## 2018-09-13 LAB — FERRITIN: Ferritin: 14 ng/mL — ABNORMAL LOW (ref 30–400)

## 2018-09-13 LAB — IRON AND TIBC
Iron Saturation: 8 % — CL (ref 15–55)
Iron: 30 ug/dL — ABNORMAL LOW (ref 38–169)
Total Iron Binding Capacity: 367 ug/dL (ref 250–450)
UIBC: 337 ug/dL (ref 111–343)

## 2018-09-13 LAB — URIC ACID: Uric Acid: 2.4 mg/dL — ABNORMAL LOW (ref 3.7–8.6)

## 2018-09-19 NOTE — Progress Notes (Signed)
Please send to the physician who ordered these labs. Not ordered by Sander Radon Thanks

## 2018-09-23 ENCOUNTER — Other Ambulatory Visit: Payer: Self-pay

## 2018-09-24 ENCOUNTER — Other Ambulatory Visit: Payer: Self-pay

## 2018-09-24 ENCOUNTER — Ambulatory Visit: Payer: Self-pay | Admitting: Pharmacy Technician

## 2018-09-24 ENCOUNTER — Encounter (INDEPENDENT_AMBULATORY_CARE_PROVIDER_SITE_OTHER): Payer: Self-pay

## 2018-09-24 DIAGNOSIS — Z79899 Other long term (current) drug therapy: Secondary | ICD-10-CM

## 2018-09-24 NOTE — Progress Notes (Signed)
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  Patient to sign.  Verbally read [facility]'s contract.  Patient verbally acknowledged that he understood and did not have questions about the contract.  Patient to sign contract.    Patient stated the PAP Application for Janumet already completed at [facility].  Approved for 1 year by [facility].  [facility] mailing Janumet to patient's home.  Patient does not want [facility] to take-over management of [place].  Patient wants [facility] to continue management of this medication.  Patient approved to receive medication assistance as long as eligibility criteria continues to be met.  [facility]

## 2018-09-25 LAB — FECAL OCCULT BLOOD, IMMUNOCHEMICAL: Fecal Occult Bld: NEGATIVE

## 2019-03-11 ENCOUNTER — Other Ambulatory Visit: Payer: Self-pay

## 2019-03-11 DIAGNOSIS — Z20822 Contact with and (suspected) exposure to covid-19: Secondary | ICD-10-CM

## 2019-03-12 LAB — NOVEL CORONAVIRUS, NAA: SARS-CoV-2, NAA: NOT DETECTED

## 2019-04-30 ENCOUNTER — Other Ambulatory Visit: Payer: Self-pay

## 2019-04-30 DIAGNOSIS — Z20822 Contact with and (suspected) exposure to covid-19: Secondary | ICD-10-CM

## 2019-05-02 LAB — NOVEL CORONAVIRUS, NAA: SARS-CoV-2, NAA: NOT DETECTED

## 2019-08-30 ENCOUNTER — Other Ambulatory Visit: Payer: Self-pay

## 2019-08-31 ENCOUNTER — Other Ambulatory Visit: Payer: Self-pay

## 2019-08-31 ENCOUNTER — Ambulatory Visit: Payer: Self-pay | Admitting: Internal Medicine

## 2019-08-31 DIAGNOSIS — K2990 Gastroduodenitis, unspecified, without bleeding: Secondary | ICD-10-CM

## 2019-08-31 DIAGNOSIS — E119 Type 2 diabetes mellitus without complications: Secondary | ICD-10-CM

## 2019-08-31 MED ORDER — FREESTYLE TEST VI STRP: ORAL_STRIP | 3 refills | Status: DC

## 2019-08-31 MED ORDER — GLIMEPIRIDE 2 MG PO TABS: 2.0000 mg | ORAL_TABLET | Freq: Every day | ORAL | 3 refills | Status: DC

## 2019-08-31 MED ORDER — CYCLOBENZAPRINE HCL 10 MG PO TABS: 10.0000 mg | ORAL_TABLET | Freq: Once | ORAL | 1 refills | Status: AC

## 2019-08-31 MED ORDER — BLOOD GLUCOSE METER KIT: PACK | 3 refills | Status: DC

## 2019-08-31 MED ORDER — OMEPRAZOLE 10 MG PO CPDR: 40.0000 mg | DELAYED_RELEASE_CAPSULE | Freq: Once | ORAL | 3 refills | Status: DC

## 2019-08-31 MED ORDER — CYCLOBENZAPRINE HCL 10 MG PO TABS: 10.0000 mg | ORAL_TABLET | Freq: Once | ORAL | 1 refills | Status: DC

## 2019-08-31 MED ORDER — SITAGLIPTIN PHOS-METFORMIN HCL 50-1000 MG PO TABS: ORAL_TABLET | ORAL | 3 refills | Status: AC

## 2019-08-31 MED ORDER — BLOOD GLUCOSE METER KIT: PACK | 3 refills | Status: AC

## 2019-08-31 MED ORDER — ATORVASTATIN CALCIUM 20 MG PO TABS: 20.0000 mg | ORAL_TABLET | Freq: Once | ORAL | 3 refills | Status: DC

## 2019-08-31 MED ORDER — OMEPRAZOLE 10 MG PO CPDR: 20.0000 mg | DELAYED_RELEASE_CAPSULE | Freq: Two times a day (BID) | ORAL | 3 refills | Status: DC

## 2019-08-31 MED ORDER — FREESTYLE TEST VI STRP: ORAL_STRIP | 6 refills | Status: AC

## 2019-08-31 MED ORDER — ENALAPRIL MALEATE 10 MG PO TABS: 10.0000 mg | ORAL_TABLET | Freq: Once | ORAL | 3 refills | Status: DC

## 2019-08-31 NOTE — Addendum Note (Signed)
Addended by: Mordecai Rasmussen on: 08/31/2019 03:47 PM   Modules accepted: Orders

## 2019-08-31 NOTE — Progress Notes (Signed)
PATIENT NAME: Timothy Hancock    MR#:  [id number]  DATE OF BIRTH:  08-30-1965  DATE OF ADMISSION:  (Not on file)  PRIMARY CARE PHYSICIAN: Patient, No Pcp Per   REQUESTING/REFERRING PHYSICIAN:  CHIEF COMPLAINT:  Needed check-up, lab work done, size of stomach is concerning, complains of indigestion, eye appointment is overdue, partial intestinal blockage several years ago but had a negative colonoscopy and endoscopy  HISTORY OF PRESENT ILLNESS:  Timothy Hancock  is a 54 y.o. male with a known history of diabetes PAST MEDICAL HISTORY:   Past Medical History:  Diagnosis Date  . Diabetes ([place])     PAST SURGICAL HISTORY:   Past Surgical History:  Procedure Laterality Date  . APPENDECTOMY    . CHOLECYSTECTOMY      SOCIAL HISTORY:   Social History   Tobacco Use  . Smoking status: Never Smoker  . Smokeless tobacco: Never Used  Substance Use Topics  . Alcohol use: No    FAMILY HISTORY:   Family History  Problem Relation Age of Onset  . Diabetes Mellitus II Mother   . Diabetes Mellitus II Father     DRUG ALLERGIES:  No Known Allergies  REVIEW OF SYSTEMS:   complains of fullness in the stomach area, diabetes is related to eating and will check blood sugar and hemoglobin A1C  MEDICATIONS AT HOME:   Prior to Admission medications   Medication Sig Start Date End Date Taking? Authorizing Provider  omeprazole (PRILOSEC) 10 MG capsule Take 40 mg by mouth daily.   Yes [provider]  sitaGLIPtin-metformin (JANUMET) 50-1000 MG tablet Take 1 tablet by mouth 2 (two) times daily with a meal.   Yes [provider]  cyclobenzaprine (FLEXERIL) 10 MG tablet Take 1 tablet (10 mg total) by mouth 3 (three) times daily as needed. 08/14/18   Sable Feil, PA-C  methylPREDNISolone (MEDROL DOSEPAK) 4 MG TBPK tablet Take Tapered dose as directed 08/14/18   Sable Feil, PA-C      VITAL SIGNS:  There were no vitals taken for this visit.    PHYSICAL  EXAMINATION:  Physical Exam  GENERAL:  54 y.o.-year-old patient lying in the bed with no acute distress.  EYES: Pupils equal, round, reactive to light and accommodation. No scleral icterus. Extraocular muscles intact.  HEENT: Head atraumatic, normocephalic. Oropharynx and nasopharynx clear.  NECK:  Supple, no jugular venous distention. No thyroid enlargement, no tenderness.  LUNGS: Normal breath sounds bilaterally, no wheezing, rales,rhonchi or crepitation. No use of accessory muscles of respiration.  CARDIOVASCULAR: S1, S2 normal. No murmurs, rubs, or gallops.  ABDOMEN: Soft, nontender, nondistended. Bowel sounds present. No organomegaly or mass.  EXTREMITIES: No pedal edema, cyanosis, or clubbing.  NEUROLOGIC: Cranial nerves II through XII are intact. Muscle strength 5/5 in all extremities. Sensation intact. Gait not checked.  PSYCHIATRIC: The patient is alert and oriented x 3.  SKIN: No obvious rash, lesion, or ulcer.   LABORATORY PANEL:   CBC No results for input(s): WBC, HGB, HCT, PLT in the last 168 hours. ------------------------------------------------------------------------------------------------------------------  Chemistries  No results for input(s): NA, K, CL, CO2, GLUCOSE, BUN, CREATININE, CALCIUM, MG, AST, ALT, ALKPHOS, BILITOT in the last 168 hours.  Invalid input(s): GFRCGP ------------------------------------------------------------------------------------------------------------------ ------------------------------------------------------------------------------------------------------------------  RADIOLOGY:  No results found.    IMPRESSION AND PLAN:   Patient needs an eye doctor to check him because of his diabetes, he needs a COVID shot, need a statin (cholesterol medicine) ace inhibitor, will check for hep C  as a routine and PSA Prostrate exam was done in clinic and it was okay       Management plans discussed with the patient, family and they are in  agreement.  CODE STATUS: Will discuss next visit  TOTAL TIME TAKING CARE OF THIS PATIENT: 45 minutes.   Follow-up in 3 months  Corky Downs M.D on 08/31/2019 at 12:27 PM    Note: This dictation was prepared with Dragon dictation along with smaller phrase technology. Any transcriptional errors that result from this process are unintentional.

## 2019-09-06 ENCOUNTER — Other Ambulatory Visit: Payer: Self-pay | Admitting: Internal Medicine

## 2019-09-07 LAB — CBC WITH DIFFERENTIAL/PLATELET
Basophils Absolute: 0 10*3/uL (ref 0.0–0.2)
Basos: 1 %
EOS (ABSOLUTE): 0.1 10*3/uL (ref 0.0–0.4)
Eos: 2 %
Hematocrit: 35.8 % — ABNORMAL LOW (ref 37.5–51.0)
Hemoglobin: 10.8 g/dL — ABNORMAL LOW (ref 13.0–17.7)
Immature Grans (Abs): 0 10*3/uL (ref 0.0–0.1)
Immature Granulocytes: 0 %
Lymphocytes Absolute: 1.8 10*3/uL (ref 0.7–3.1)
Lymphs: 32 %
MCH: 24 pg — ABNORMAL LOW (ref 26.6–33.0)
MCHC: 30.2 g/dL — ABNORMAL LOW (ref 31.5–35.7)
MCV: 80 fL (ref 79–97)
Monocytes Absolute: 0.4 10*3/uL (ref 0.1–0.9)
Monocytes: 8 %
Neutrophils Absolute: 3.3 10*3/uL (ref 1.4–7.0)
Neutrophils: 57 %
Platelets: 148 10*3/uL — ABNORMAL LOW (ref 150–450)
RBC: 4.5 x10E6/uL (ref 4.14–5.80)
RDW: 15.3 % (ref 11.6–15.4)
WBC: 5.6 10*3/uL (ref 3.4–10.8)

## 2019-09-07 LAB — URINALYSIS, ROUTINE W REFLEX MICROSCOPIC
Bilirubin, UA: NEGATIVE
Glucose, UA: NEGATIVE
Leukocytes,UA: NEGATIVE
Nitrite, UA: NEGATIVE
RBC, UA: NEGATIVE
Specific Gravity, UA: 1.028 (ref 1.005–1.030)
Urobilinogen, Ur: 0.2 mg/dL (ref 0.2–1.0)
pH, UA: 7 (ref 5.0–7.5)

## 2019-09-07 LAB — COMPREHENSIVE METABOLIC PANEL
ALT: 21 IU/L (ref 0–44)
AST: 16 IU/L (ref 0–40)
Albumin/Globulin Ratio: 1.7 (ref 1.2–2.2)
Albumin: 4.4 g/dL (ref 3.8–4.9)
Alkaline Phosphatase: 79 IU/L (ref 39–117)
BUN/Creatinine Ratio: 12 (ref 9–20)
BUN: 14 mg/dL (ref 6–24)
Bilirubin Total: 0.4 mg/dL (ref 0.0–1.2)
CO2: 22 mmol/L (ref 20–29)
Calcium: 9.3 mg/dL (ref 8.7–10.2)
Chloride: 104 mmol/L (ref 96–106)
Creatinine, Ser: 1.13 mg/dL (ref 0.76–1.27)
GFR calc Af Amer: 84 mL/min/{1.73_m2} (ref >59)
GFR calc non Af Amer: 73 mL/min/{1.73_m2} (ref >59)
Globulin, Total: 2.6 g/dL (ref 1.5–4.5)
Glucose: 134 mg/dL — ABNORMAL HIGH (ref 65–99)
Potassium: 4.2 mmol/L (ref 3.5–5.2)
Sodium: 140 mmol/L (ref 134–144)
Total Protein: 7 g/dL (ref 6.0–8.5)

## 2019-09-07 LAB — PSA: Prostate Specific Ag, Serum: 2 ng/mL (ref 0.0–4.0)

## 2019-09-07 LAB — HEPATITIS C ANTIBODY: Hep C Virus Ab: <0.1 s/co ratio (ref 0.0–0.9)

## 2019-09-07 LAB — LIPID PANEL W/O CHOL/HDL RATIO
Cholesterol, Total: 149 mg/dL (ref 100–199)
HDL: 24 mg/dL — ABNORMAL LOW (ref >39)
LDL Chol Calc (NIH): 96 mg/dL (ref 0–99)
Triglycerides: 164 mg/dL — ABNORMAL HIGH (ref 0–149)
VLDL Cholesterol Cal: 29 mg/dL (ref 5–40)

## 2019-09-07 LAB — HGB A1C W/O EAG: Hgb A1c MFr Bld: 7.5 % — ABNORMAL HIGH (ref 4.8–5.6)

## 2019-09-07 LAB — TSH: TSH: 2.11 u[IU]/mL (ref 0.450–4.500)

## 2019-11-30 ENCOUNTER — Ambulatory Visit: Payer: Self-pay | Admitting: Internal Medicine

## 2019-12-14 ENCOUNTER — Ambulatory Visit: Payer: Self-pay | Admitting: Internal Medicine

## 2020-01-03 ENCOUNTER — Other Ambulatory Visit: Payer: Self-pay

## 2020-01-04 ENCOUNTER — Other Ambulatory Visit: Payer: Self-pay

## 2020-01-04 ENCOUNTER — Ambulatory Visit: Payer: Self-pay | Admitting: Adult Health

## 2020-01-04 DIAGNOSIS — E119 Type 2 diabetes mellitus without complications: Secondary | ICD-10-CM

## 2020-01-04 DIAGNOSIS — K219 Gastro-esophageal reflux disease without esophagitis: Secondary | ICD-10-CM

## 2020-01-04 DIAGNOSIS — E782 Mixed hyperlipidemia: Secondary | ICD-10-CM

## 2020-01-04 MED ORDER — GLIMEPIRIDE 2 MG PO TABS: 2.0000 mg | ORAL_TABLET | Freq: Every day | ORAL | 3 refills | Status: DC

## 2020-01-04 NOTE — Addendum Note (Signed)
Addended by: Orville Govern on: 01/04/2020 10:51 AM   Modules accepted: Orders

## 2020-01-04 NOTE — Patient Instructions (Signed)
Return to clinic in 2 weeks  I am checking labs for diabetes, cholesterol and iron  Refill for glimepiride sent to [facility] in [place]

## 2020-01-04 NOTE — Progress Notes (Signed)
Established Patient Office Visit  Subjective:  Patient ID: Timothy Hancock, male    DOB: 1965-09-06  Age: 54 y.o. MRN: [id number]  CC: No chief complaint on file.   HPI [name] presents for f/u diabetes, HLD, GERD and anemia  Past Medical History:  Diagnosis Date  . Diabetes [facility])     Past Surgical History:  Procedure Laterality Date  . APPENDECTOMY    . CHOLECYSTECTOMY      Family History  Problem Relation Age of Onset  . Diabetes Mellitus II Mother   . Diabetes Mellitus II Father     Social History   Socioeconomic History  . Marital status: Married    Spouse name: Not on file  . Number of children: Not on file  . Years of education: Not on file  . Highest education level: Not on file  Occupational History  . Not on file  Tobacco Use  . Smoking status: Never Smoker  . Smokeless tobacco: Never Used  Substance and Sexual Activity  . Alcohol use: No  . Drug use: Not on file  . Sexual activity: Not on file  Other Topics Concern  . Not on file  Social History Narrative  . Not on file   Social Determinants of Health   Financial Resource Strain:   . Difficulty of Paying Living Expenses:   Food Insecurity:   . Worried About Charity fundraiser in the Last Year:   . Arboriculturist in the Last Year:   Transportation Needs:   . Film/video editor (Medical):   Marland Kitchen Lack of Transportation (Non-Medical):   Physical Activity:   . Days of Exercise per Week:   . Minutes of Exercise per Session:   Stress:   . Feeling of Stress :   Social Connections:   . Frequency of Communication with Friends and Family:   . Frequency of Social Gatherings with Friends and Family:   . Attends Religious Services:   . Active Member of Clubs or Organizations:   . Attends Archivist Meetings:   Marland Kitchen Marital Status:   Intimate Partner Violence:   . Fear of Current or Ex-Partner:   . Emotionally Abused:   Marland Kitchen Physically Abused:   . Sexually Abused:      Outpatient Medications Prior to Visit  Medication Sig Dispense Refill  . blood glucose meter kit and supplies Dispense based on patient and insurance preference. Use up to four times daily as directed. (FOR ICD-10 E10.9, E11.9). 1 each 3  . glimepiride (AMARYL) 2 MG tablet Take 1 tablet (2 mg total) by mouth daily before breakfast. 90 tablet 3  . glucose blood (FREESTYLE TEST STRIPS) test strip Use as instructed 100 each 6  . omeprazole (PRILOSEC) 10 MG capsule Take 4 capsules (40 mg total) by mouth once for 1 dose. 90 capsule 3  . sitaGLIPtin-metformin (JANUMET) 50-1000 MG tablet Timothy Hancock 180 tablet 3   No facility-administered medications prior to visit.    No Known Allergies  ROS Review of Systems    Objective:    Physical Exam  There were no vitals taken for this visit. Wt Readings from Last 3 Encounters:  08/14/18 190 lb (86.2 kg)  08/10/15 193 lb 4.8 oz (87.7 kg)     Health Maintenance Due  Topic Date Due  . COVID-19 Vaccine (1) Never done  . HIV Screening  Never done  . TETANUS/TDAP  Never done  . COLONOSCOPY  Never done  .  URINE MICROALBUMIN  09/12/2019    There are no preventive care reminders to display for this patient.  Lab Results  Component Value Date   TSH 2.110 09/06/2019   Lab Results  Component Value Date   WBC 5.6 09/06/2019   HGB 10.8 (L) 09/06/2019   HCT 35.8 (L) 09/06/2019   MCV 80 09/06/2019   PLT 148 (L) 09/06/2019   Lab Results  Component Value Date   NA 140 09/06/2019   K 4.2 09/06/2019   CO2 22 09/06/2019   GLUCOSE 134 (H) 09/06/2019   BUN 14 09/06/2019   CREATININE 1.13 09/06/2019   BILITOT 0.4 09/06/2019   ALKPHOS 79 09/06/2019   AST 16 09/06/2019   ALT 21 09/06/2019   PROT 7.0 09/06/2019   ALBUMIN 4.4 09/06/2019   CALCIUM 9.3 09/06/2019   ANIONGAP 7 08/09/2015   Lab Results  Component Value Date   CHOL 149 09/06/2019   Lab Results  Component Value Date   HDL 24 (L) 09/06/2019   Lab Results  Component  Value Date   LDLCALC 96 09/06/2019   Lab Results  Component Value Date   TRIG 164 (H) 09/06/2019   No results found for: CHOLHDL Lab Results  Component Value Date   HGBA1C 7.5 (H) 09/06/2019      Assessment & Plan:   Problem List Items Addressed This Visit    None    Visit Diagnoses    Type 2 diabetes mellitus without complication, without long-term current use of insulin (HCC)    -  Primary   Gastroesophageal reflux disease, unspecified whether esophagitis present       Mixed hyperlipidemia         1. Type 2 diabetes mellitus without complication, without long-term current use of insulin ([place]) Patient reports trying to improve diet. He does not take glimepiride daily. He only takes with high carb meals which is not ideal. He takes janumet 50-1000 and he reports getting through clinic. Will send refill on glimepiride. Discussed statin for cardioprotection. He is opposed at this time. I have asked him to research reputable sites such as the ADA for recommendations to protect from CV disease. Key is prevention, not to begin treatment when problem already exists. Continue to encourage. He report his blood pressure is well controlled with systolic readings 597 and diastolic in the 41U. Continue to monitor.   Labs: Check LAGT3M, basic metabolic panel, lipid panel He will set up appointment at [facility] for his diabetic eye exam  2. Gastroesophageal reflux disease, unspecified whether esophagitis present Doing well with omeprazole. Reports does not need refill at this time  3. Mixed hyperlipidemia Check lipids. His HDL was low previously. Discussed increasing physical activity.   4. Anemia Denies blood in urine, stool. Denies fatigue, abdominal pain.   Check iron studies. If iron studies normal then will need to r/o thalassemia. Fecal occult blood  Iron panel - iron, transferrin, TIBC and % iron saturation  RTC to review labs and discuss options for management in 2 weeks  CBC     Component Value Date/Time   WBC 5.6 09/06/2019 0918   WBC 11.7 (H) 08/09/2015 0105   RBC 4.50 09/06/2019 0918   RBC 4.95 08/09/2015 0105   HGB 10.8 (L) 09/06/2019 0918   HCT 35.8 (L) 09/06/2019 0918   PLT 148 (L) 09/06/2019 0918   MCV 80 09/06/2019 0918   MCV 85 12/29/2013 0519   MCH 24.0 (L) 09/06/2019 0918   MCH 25.8 (L) 08/09/2015 0105  MCHC 30.2 (L) 09/06/2019 0918   MCHC 33.1 08/09/2015 0105   RDW 15.3 09/06/2019 0918   RDW 14.7 (H) 12/29/2013 0519   LYMPHSABS 1.8 09/06/2019 0918   LYMPHSABS 0.8 (L) 12/29/2013 0519   MONOABS 0.4 12/29/2013 0519   EOSABS 0.1 09/06/2019 0918   EOSABS 0.0 12/29/2013 0519   BASOSABS 0.0 09/06/2019 0918   BASOSABS 0.0 12/29/2013 0519     No orders of the defined types were placed in this encounter.   Follow-up:  RTC 2 weeks   Tae Vonada Dagoberto Ligas, NP

## 2020-01-18 ENCOUNTER — Ambulatory Visit: Payer: Self-pay | Admitting: Internal Medicine

## 2020-02-01 ENCOUNTER — Ambulatory Visit: Payer: Self-pay | Admitting: Internal Medicine

## 2020-02-01 ENCOUNTER — Ambulatory Visit: Payer: Self-pay | Admitting: Cardiovascular Disease

## 2020-03-14 ENCOUNTER — Ambulatory Visit: Payer: Self-pay | Admitting: Internal Medicine

## 2020-03-28 ENCOUNTER — Ambulatory Visit: Payer: Self-pay | Admitting: Internal Medicine

## 2020-07-06 ENCOUNTER — Telehealth: Payer: Self-pay | Admitting: Pharmacy Technician

## 2020-07-06 NOTE — Telephone Encounter (Signed)
Patient failed to provide 2021 proof of income.  No additional medication assistance will be provided by [facility] without the required proof of income documentation.  Patient notified by letter.  Reed Dady J. Dacota Ruben Care Manager [facility]   [address] [place], [place]  [address]     This is to inform you that you are no longer eligible to receive medication assistance at [facility].  The reason(s) are:    _____Your total gross monthly household income exceeds 250% of the Federal Poverty Level.   _____Tangible assets (savings, checking, stocks/bonds, pension, retirement, etc.) exceeds our limit  _____You are eligible to receive benefits from Medicaid, [facility] or HIV Medication              Assistance Program _____You are eligible to receive benefits from a Medicare Part "D" plan _____You have prescription insurance  _____You are not an [place] resident __X__Failure to provide all requested proof of income information for 2021.    Medication assistance will resume once all requested financial information has been returned to our clinic.  If you have questions, please contact our clinic at [phone].    Thank you,  [facility] 

## 2020-07-07 ENCOUNTER — Other Ambulatory Visit: Payer: Self-pay | Admitting: Adult Health

## 2020-07-09 LAB — COMPREHENSIVE METABOLIC PANEL
ALT: 14 IU/L (ref 0–44)
AST: 11 IU/L (ref 0–40)
Albumin/Globulin Ratio: 1.9 (ref 1.2–2.2)
Albumin: 4.5 g/dL (ref 3.8–4.9)
Alkaline Phosphatase: 85 IU/L (ref 44–121)
BUN/Creatinine Ratio: 11 (ref 9–20)
BUN: 12 mg/dL (ref 6–24)
Bilirubin Total: 0.4 mg/dL (ref 0.0–1.2)
CO2: 24 mmol/L (ref 20–29)
Calcium: 9.3 mg/dL (ref 8.7–10.2)
Chloride: 105 mmol/L (ref 96–106)
Creatinine, Ser: 1.11 mg/dL (ref 0.76–1.27)
GFR calc Af Amer: 85 mL/min/{1.73_m2} (ref >59)
GFR calc non Af Amer: 74 mL/min/{1.73_m2} (ref >59)
Globulin, Total: 2.4 g/dL (ref 1.5–4.5)
Glucose: 144 mg/dL — ABNORMAL HIGH (ref 65–99)
Potassium: 4.3 mmol/L (ref 3.5–5.2)
Sodium: 142 mmol/L (ref 134–144)
Total Protein: 6.9 g/dL (ref 6.0–8.5)

## 2020-07-09 LAB — URIC ACID: Uric Acid: 4.5 mg/dL (ref 3.8–8.4)

## 2020-07-09 LAB — URINALYSIS, ROUTINE W REFLEX MICROSCOPIC
Bilirubin, UA: NEGATIVE
Ketones, UA: NEGATIVE
Leukocytes,UA: NEGATIVE
Nitrite, UA: NEGATIVE
RBC, UA: NEGATIVE
Specific Gravity, UA: 1.025 (ref 1.005–1.030)
Urobilinogen, Ur: 0.2 mg/dL (ref 0.2–1.0)
pH, UA: 7.5 (ref 5.0–7.5)

## 2020-07-09 LAB — CBC WITH DIFFERENTIAL/PLATELET
Basophils Absolute: 0 10*3/uL (ref 0.0–0.2)
Basos: 1 %
EOS (ABSOLUTE): 0.1 10*3/uL (ref 0.0–0.4)
Eos: 2 %
Hematocrit: 36.6 % — ABNORMAL LOW (ref 37.5–51.0)
Hemoglobin: 11.1 g/dL — ABNORMAL LOW (ref 13.0–17.7)
Immature Grans (Abs): 0 10*3/uL (ref 0.0–0.1)
Immature Granulocytes: 0 %
Lymphocytes Absolute: 1.6 10*3/uL (ref 0.7–3.1)
Lymphs: 30 %
MCH: 24.3 pg — ABNORMAL LOW (ref 26.6–33.0)
MCHC: 30.3 g/dL — ABNORMAL LOW (ref 31.5–35.7)
MCV: 80 fL (ref 79–97)
Monocytes Absolute: 0.4 10*3/uL (ref 0.1–0.9)
Monocytes: 8 %
Neutrophils Absolute: 3.3 10*3/uL (ref 1.4–7.0)
Neutrophils: 59 %
Platelets: 188 10*3/uL (ref 150–450)
RBC: 4.57 x10E6/uL (ref 4.14–5.80)
RDW: 15.7 % — ABNORMAL HIGH (ref 11.6–15.4)
WBC: 5.5 10*3/uL (ref 3.4–10.8)

## 2020-07-09 LAB — IRON AND TIBC
Iron Saturation: 6 % — CL (ref 15–55)
Iron: 25 ug/dL — ABNORMAL LOW (ref 38–169)
Total Iron Binding Capacity: 401 ug/dL (ref 250–450)
UIBC: 376 ug/dL — ABNORMAL HIGH (ref 111–343)

## 2020-07-09 LAB — FERRITIN: Ferritin: 8 ng/mL — ABNORMAL LOW (ref 30–400)

## 2020-07-09 LAB — LIPID PANEL W/O CHOL/HDL RATIO
Cholesterol, Total: 142 mg/dL (ref 100–199)
HDL: 25 mg/dL — ABNORMAL LOW (ref >39)
LDL Chol Calc (NIH): 90 mg/dL (ref 0–99)
Triglycerides: 152 mg/dL — ABNORMAL HIGH (ref 0–149)
VLDL Cholesterol Cal: 27 mg/dL (ref 5–40)

## 2020-07-09 LAB — URINE CULTURE

## 2020-07-09 LAB — HGB A1C W/O EAG: Hgb A1c MFr Bld: 8.1 % — ABNORMAL HIGH (ref 4.8–5.6)

## 2020-07-09 LAB — TRANSFERRIN: Transferrin: 346 mg/dL — ABNORMAL HIGH (ref 177–329)

## 2020-07-09 LAB — VITAMIN D 25 HYDROXY (VIT D DEFICIENCY, FRACTURES): Vit D, 25-Hydroxy: 14 ng/mL — ABNORMAL LOW (ref 30.0–100.0)

## 2020-07-11 ENCOUNTER — Ambulatory Visit: Payer: Self-pay | Admitting: Internal Medicine

## 2020-07-11 ENCOUNTER — Other Ambulatory Visit: Payer: Self-pay

## 2020-07-11 DIAGNOSIS — E1165 Type 2 diabetes mellitus with hyperglycemia: Secondary | ICD-10-CM

## 2020-07-11 DIAGNOSIS — D509 Iron deficiency anemia, unspecified: Secondary | ICD-10-CM

## 2020-07-11 MED ORDER — GLIMEPIRIDE 2 MG PO TABS: 2.0000 mg | ORAL_TABLET | Freq: Two times a day (BID) | ORAL | 3 refills | Status: DC

## 2020-07-11 NOTE — Progress Notes (Signed)
   Internal MEDICINE  Office Visit Note  Patient Name: Timothy Hancock  [id number]  [id number]  Date of Service: 07/11/2020  No chief complaint on file.   HPI  Pt is here for routine follow up. - lab review- 07/08/19 DM- take med- take janumet- 50/1000  x2 times and glimepiride 2 mg daily-  AIC 8.1  - other  Anemia- HGB 11.1    Current Medication: Outpatient Encounter Medications as of 07/11/2020  Medication Sig  . blood glucose meter kit and supplies Dispense based on patient and insurance preference. Use up to four times daily as directed. (FOR ICD-10 E10.9, E11.9).  Marland Kitchen glimepiride (AMARYL) 2 MG tablet Take 1 tablet (2 mg total) by mouth daily before breakfast.  . glucose blood (FREESTYLE TEST STRIPS) test strip Use as instructed  . omeprazole (PRILOSEC) 10 MG capsule Take 4 capsules (40 mg total) by mouth once for 1 dose.  . sitaGLIPtin-metformin (JANUMET) 50-1000 MG tablet [name]   No facility-administered encounter medications on file as of 07/11/2020.    Surgical History: Past Surgical History:  Procedure Laterality Date  . APPENDECTOMY    . CHOLECYSTECTOMY      Medical History: Past Medical History:  Diagnosis Date  . Diabetes ([place])     Family History: Family History  Problem Relation Age of Onset  . Diabetes Mellitus II Mother   . Diabetes Mellitus II Father     Social History   Socioeconomic History  . Marital status: Married    Spouse name: Not on file  . Number of children: Not on file  . Years of education: Not on file  . Highest education level: Not on file  Occupational History  . Not on file  Tobacco Use  . Smoking status: Never Smoker  . Smokeless tobacco: Never Used  Substance and Sexual Activity  . Alcohol use: No  . Drug use: Not on file  . Sexual activity: Not on file  Other Topics Concern  . Not on file  Social History Narrative  . Not on file   Social Determinants of Health   Financial Resource Strain: Not on file  Food  Insecurity: Not on file  Transportation Needs: Not on file  Physical Activity: Not on file  Stress: Not on file  Social Connections: Not on file  Intimate Partner Violence: Not on file      Review of Systems  Vital Signs: There were no vitals taken for this visit.   Physical Exam     Assessment/Plan: DM- AIC 8.1- all labs reviewed with patient on phone-  Continue janumet and increase glemiperide to 2/ day Anemia- irn Def- HGB 11.1- better then last year- recommend iron OTC 325 mg daily. Repeat lab in 3 month- with f/u after.   General Counseling: Daimien verbalizes understanding of the findings of todays visit and agrees with plan of treatment. I have discussed any further diagnostic evaluation that may be needed or ordered today. We also reviewed his medications today. he has been encouraged to call the office with any questions or concerns that should arise related to todays visit.    No orders of the defined types were placed in this encounter.   No orders of the defined types were placed in this encounter.       Wenda Low

## 2020-11-24 NOTE — Progress Notes (Deleted)
Established Patient Office Visit  Subjective:  Patient ID: Timothy Hancock, male    DOB: [birth year]  Age: 55 y.o. MRN: [id number]  CC: No chief complaint on file.   HPI Timothy Hancock presents for ***  Past Medical History:  Diagnosis Date  . Diabetes [facility])     Past Surgical History:  Procedure Laterality Date  . APPENDECTOMY    . CHOLECYSTECTOMY      Family History  Problem Relation Age of Onset  . Diabetes Mellitus II Mother   . Diabetes Mellitus II Father     Social History   Socioeconomic History  . Marital status: Married    Spouse name: Not on file  . Number of children: Not on file  . Years of education: Not on file  . Highest education level: Not on file  Occupational History  . Not on file  Tobacco Use  . Smoking status: Never Smoker  . Smokeless tobacco: Never Used  Substance and Sexual Activity  . Alcohol use: No  . Drug use: Not on file  . Sexual activity: Not on file  Other Topics Concern  . Not on file  Social History Narrative  . Not on file   Social Determinants of Health   Financial Resource Strain: Not on file  Food Insecurity: Not on file  Transportation Needs: Not on file  Physical Activity: Not on file  Stress: Not on file  Social Connections: Not on file  Intimate Partner Violence: Not on file    Outpatient Medications Prior to Visit  Medication Sig Dispense Refill  . blood glucose meter kit and supplies Dispense based on patient and insurance preference. Use up to four times daily as directed. (FOR ICD-10 E10.9, E11.9). 1 each 3  . glimepiride (AMARYL) 2 MG tablet Take 1 tablet (2 mg total) by mouth 2 (two) times daily at 10 AM and 5 PM. 180 tablet 3  . glucose blood (FREESTYLE TEST STRIPS) test strip Use as instructed 100 each 6  . omeprazole (PRILOSEC) 10 MG capsule Take 4 capsules (40 mg total) by mouth once for 1 dose. 90 capsule 3  . sitaGLIPtin-metformin (JANUMET) 50-1000 MG tablet [name] 180 tablet 3    No facility-administered medications prior to visit.    No Known Allergies  ROS Review of Systems    Objective:    Physical Exam  There were no vitals taken for this visit. Wt Readings from Last 3 Encounters:  08/14/18 190 lb (86.2 kg)  08/10/15 193 lb 4.8 oz (87.7 kg)     Health Maintenance Due  Topic Date Due  . COVID-19 Vaccine (1) Never done  . HIV Screening  Never done  . COLONOSCOPY (Pts 45-11yr Insurance coverage will need to be confirmed)  Never done  . Zoster Vaccines- Shingrix (1 of 2) Never done  . URINE MICROALBUMIN  09/12/2019    There are no preventive care reminders to display for this patient.  Lab Results  Component Value Date   TSH 2.110 09/06/2019   Lab Results  Component Value Date   WBC 5.5 07/07/2020   HGB 11.1 (L) 07/07/2020   HCT 36.6 (L) 07/07/2020   MCV 80 07/07/2020   PLT 188 07/07/2020   Lab Results  Component Value Date   NA 142 07/07/2020   K 4.3 07/07/2020   CO2 24 07/07/2020   GLUCOSE 144 (H) 07/07/2020   BUN 12 07/07/2020   CREATININE 1.11 07/07/2020   BILITOT 0.4 07/07/2020  ALKPHOS 85 07/07/2020   AST 11 07/07/2020   ALT 14 07/07/2020   PROT 6.9 07/07/2020   ALBUMIN 4.5 07/07/2020   CALCIUM 9.3 07/07/2020   ANIONGAP 7 08/09/2015   Lab Results  Component Value Date   CHOL 142 07/07/2020   Lab Results  Component Value Date   HDL 25 (L) 07/07/2020   Lab Results  Component Value Date   LDLCALC 90 07/07/2020   Lab Results  Component Value Date   TRIG 152 (H) 07/07/2020   No results found for: CHOLHDL Lab Results  Component Value Date   HGBA1C 8.1 (H) 07/07/2020      Assessment & Plan:   Problem List Items Addressed This Visit   None     No orders of the defined types were placed in this encounter.   Follow-up: No follow-ups on file.    Khamari Yousuf

## 2020-11-27 ENCOUNTER — Other Ambulatory Visit: Payer: Self-pay | Admitting: Internal Medicine

## 2020-11-28 ENCOUNTER — Ambulatory Visit: Payer: Self-pay | Admitting: Internal Medicine

## 2020-11-28 LAB — NOVEL CORONAVIRUS, NAA: SARS-CoV-2, NAA: NOT DETECTED

## 2020-11-28 LAB — SARS-COV-2, NAA 2 DAY TAT

## 2021-04-10 ENCOUNTER — Other Ambulatory Visit: Payer: Self-pay

## 2021-04-10 ENCOUNTER — Ambulatory Visit: Payer: Self-pay | Admitting: Internal Medicine

## 2021-04-10 ENCOUNTER — Encounter: Payer: Self-pay | Admitting: Internal Medicine

## 2021-04-10 VITALS — BP 150/80 | HR 73 | Temp 97.6°F | Ht 70.0 in | Wt 190.2 lb

## 2021-04-10 DIAGNOSIS — E1165 Type 2 diabetes mellitus with hyperglycemia: Secondary | ICD-10-CM

## 2021-04-10 NOTE — Progress Notes (Signed)
Internal MEDICINE  Office Visit Note  Patient Name: Timothy Hancock  [id number]  [id number]  Date of Service: 04/10/2021  Chief Complaint  Patient presents with   Diabetes    HPI  Pt is here for routine follow up.    Current Medication: Outpatient Encounter Medications as of 04/10/2021  Medication Sig   blood glucose meter kit and supplies Dispense based on patient and insurance preference. Use up to four times daily as directed. (FOR ICD-10 E10.9, E11.9).   glimepiride (AMARYL) 2 MG tablet Take 1 tablet (2 mg total) by mouth 2 (two) times daily at 10 AM and 5 PM.   glucose blood (FREESTYLE TEST STRIPS) test strip Use as instructed   omeprazole (PRILOSEC) 10 MG capsule Take 4 capsules (40 mg total) by mouth once for 1 dose.   sitaGLIPtin-metformin (JANUMET) 50-1000 MG tablet Timothy Hancock   No facility-administered encounter medications on file as of 04/10/2021.    Surgical History: Past Surgical History:  Procedure Laterality Date   APPENDECTOMY     CHOLECYSTECTOMY      Medical History: Past Medical History:  Diagnosis Date   Diabetes ([place])     Family History: Family History  Problem Relation Age of Onset   Diabetes Mellitus II Mother    Diabetes Mellitus II Father     Social History   Socioeconomic History   Marital status: Married    Spouse name: Not on file   Number of children: Not on file   Years of education: Not on file   Highest education level: Not on file  Occupational History   Not on file  Tobacco Use   Smoking status: Never   Smokeless tobacco: Never  Substance and Sexual Activity   Alcohol use: No   Drug use: Not on file   Sexual activity: Not on file  Other Topics Concern   Not on file  Social History Narrative   Not on file   Social Determinants of Health   Financial Resource Strain: Not on file  Food Insecurity: Not on file  Transportation Needs: Not on file  Physical Activity: Not on file  Stress: Not on file  Social  Connections: Not on file  Intimate Partner Violence: Not on file      Review of Systems  Vital Signs: BP (!) 150/80 (BP Location: Right Arm, Patient Position: Sitting, Cuff Size: Normal)   Pulse 73   Temp 97.6 F (36.4 C)   Ht 5' 10" (1.778 m)   Wt 190 lb 3.2 oz (86.3 kg)   SpO2 99%   BMI 27.29 kg/m    Physical Exam     Assessment/Plan: DM type 2- last AIC 8.1 Start on ozempic 0.25 mg titiate to 0.5 mg- side effect discussed Continue trsiba 50 units and oral BP mild high-  monitor for now Labs and f/u in 3 month  General Counseling: Timothy Hancock verbalizes understanding of the findings of todays visit and agrees with plan of treatment. I have discussed any further diagnostic evaluation that may be needed or ordered today. We also reviewed his medications today. he has been encouraged to call the office with any questions or concerns that should arise related to todays visit.    No orders of the defined types were placed in this encounter.   No orders of the defined types were placed in this encounter.       Wenda Low

## 2021-04-13 ENCOUNTER — Other Ambulatory Visit: Payer: Self-pay | Admitting: Internal Medicine

## 2021-04-14 LAB — LIPID PANEL W/O CHOL/HDL RATIO
Cholesterol, Total: 150 mg/dL (ref 100–199)
HDL: 19 mg/dL — ABNORMAL LOW (ref >39)
LDL Chol Calc (NIH): 89 mg/dL (ref 0–99)
Triglycerides: 247 mg/dL — ABNORMAL HIGH (ref 0–149)
VLDL Cholesterol Cal: 42 mg/dL — ABNORMAL HIGH (ref 5–40)

## 2021-04-14 LAB — CBC
Hematocrit: 36 % — ABNORMAL LOW (ref 37.5–51.0)
Hemoglobin: 10.4 g/dL — ABNORMAL LOW (ref 13.0–17.7)
MCH: 23 pg — ABNORMAL LOW (ref 26.6–33.0)
MCHC: 28.9 g/dL — ABNORMAL LOW (ref 31.5–35.7)
MCV: 80 fL (ref 79–97)
Platelets: 160 10*3/uL (ref 150–450)
RBC: 4.52 x10E6/uL (ref 4.14–5.80)
RDW: 15.1 % (ref 11.6–15.4)
WBC: 5.6 10*3/uL (ref 3.4–10.8)

## 2021-04-14 LAB — COMPREHENSIVE METABOLIC PANEL
ALT: 16 IU/L (ref 0–44)
AST: 12 IU/L (ref 0–40)
Albumin/Globulin Ratio: 1.9 (ref 1.2–2.2)
Albumin: 4.5 g/dL (ref 3.8–4.9)
Alkaline Phosphatase: 91 IU/L (ref 44–121)
BUN/Creatinine Ratio: 18 (ref 9–20)
BUN: 18 mg/dL (ref 6–24)
Bilirubin Total: 0.5 mg/dL (ref 0.0–1.2)
CO2: 21 mmol/L (ref 20–29)
Calcium: 9.2 mg/dL (ref 8.7–10.2)
Chloride: 100 mmol/L (ref 96–106)
Creatinine, Ser: 1 mg/dL (ref 0.76–1.27)
Globulin, Total: 2.4 g/dL (ref 1.5–4.5)
Glucose: 358 mg/dL — ABNORMAL HIGH (ref 70–99)
Potassium: 4.5 mmol/L (ref 3.5–5.2)
Sodium: 135 mmol/L (ref 134–144)
Total Protein: 6.9 g/dL (ref 6.0–8.5)
eGFR: 88 mL/min/{1.73_m2} (ref >59)

## 2021-04-14 LAB — PSA: Prostate Specific Ag, Serum: 2.3 ng/mL (ref 0.0–4.0)

## 2021-04-14 LAB — HGB A1C W/O EAG: Hgb A1c MFr Bld: 11.1 % — ABNORMAL HIGH (ref 4.8–5.6)

## 2021-06-21 ENCOUNTER — Other Ambulatory Visit: Payer: Self-pay | Admitting: Internal Medicine

## 2021-06-24 LAB — NOVEL CORONAVIRUS, NAA: SARS-CoV-2, NAA: NOT DETECTED

## 2021-06-24 LAB — SARS-COV-2, NAA 2 DAY TAT

## 2021-07-17 ENCOUNTER — Ambulatory Visit: Payer: Self-pay | Admitting: Internal Medicine

## 2021-07-31 ENCOUNTER — Ambulatory Visit: Payer: Self-pay | Admitting: Family Medicine

## 2021-09-14 ENCOUNTER — Other Ambulatory Visit: Payer: Self-pay

## 2021-09-14 ENCOUNTER — Inpatient Hospital Stay
Admission: EM | Admit: 2021-09-14 | Discharge: 2021-09-17 | DRG: 390 | Disposition: A | Discharge status: Home / Self Care | Payer: Self-pay | Attending: Internal Medicine | Admitting: Internal Medicine

## 2021-09-14 ENCOUNTER — Emergency Department: Payer: Self-pay

## 2021-09-14 DIAGNOSIS — Z9889 Other specified postprocedural states: Secondary | ICD-10-CM

## 2021-09-14 DIAGNOSIS — K529 Noninfective gastroenteritis and colitis, unspecified: Secondary | ICD-10-CM | POA: Diagnosis present

## 2021-09-14 DIAGNOSIS — Z91014 Allergy to mammalian meats: Secondary | ICD-10-CM

## 2021-09-14 DIAGNOSIS — K56609 Unspecified intestinal obstruction, unspecified as to partial versus complete obstruction: Secondary | ICD-10-CM

## 2021-09-14 DIAGNOSIS — Z20822 Contact with and (suspected) exposure to covid-19: Secondary | ICD-10-CM | POA: Diagnosis present

## 2021-09-14 DIAGNOSIS — D649 Anemia, unspecified: Secondary | ICD-10-CM | POA: Diagnosis present

## 2021-09-14 DIAGNOSIS — E1165 Type 2 diabetes mellitus with hyperglycemia: Secondary | ICD-10-CM | POA: Diagnosis present

## 2021-09-14 DIAGNOSIS — Z79899 Other long term (current) drug therapy: Secondary | ICD-10-CM

## 2021-09-14 DIAGNOSIS — Z833 Family history of diabetes mellitus: Secondary | ICD-10-CM

## 2021-09-14 DIAGNOSIS — K567 Ileus, unspecified: Principal | ICD-10-CM | POA: Diagnosis present

## 2021-09-14 DIAGNOSIS — Z794 Long term (current) use of insulin: Secondary | ICD-10-CM

## 2021-09-14 DIAGNOSIS — Z9049 Acquired absence of other specified parts of digestive tract: Secondary | ICD-10-CM

## 2021-09-14 DIAGNOSIS — K56600 Partial intestinal obstruction, unspecified as to cause: Secondary | ICD-10-CM

## 2021-09-14 DIAGNOSIS — Z8719 Personal history of other diseases of the digestive system: Secondary | ICD-10-CM

## 2021-09-14 HISTORY — DX: Partial intestinal obstruction, unspecified as to cause: K56.600

## 2021-09-14 LAB — COMPREHENSIVE METABOLIC PANEL
ALT: 15 U/L (ref 0–44)
AST: 15 U/L (ref 15–41)
Albumin: 4.1 g/dL (ref 3.5–5.0)
Alkaline Phosphatase: 63 U/L (ref 38–126)
Anion gap: 8 (ref 5–15)
BUN: 19 mg/dL (ref 6–20)
CO2: 28 mmol/L (ref 22–32)
Calcium: 9 mg/dL (ref 8.9–10.3)
Chloride: 102 mmol/L (ref 98–111)
Creatinine, Ser: 1.05 mg/dL (ref 0.61–1.24)
GFR, Estimated: >60 mL/min (ref >60)
Glucose, Bld: 274 mg/dL — ABNORMAL HIGH (ref 70–99)
Potassium: 4 mmol/L (ref 3.5–5.1)
Sodium: 138 mmol/L (ref 135–145)
Total Bilirubin: 1.1 mg/dL (ref 0.3–1.2)
Total Protein: 7.8 g/dL (ref 6.5–8.1)

## 2021-09-14 LAB — RESP PANEL BY RT-PCR (FLU A&B, COVID) ARPGX2
Influenza A by PCR: NEGATIVE
Influenza B by PCR: NEGATIVE
SARS Coronavirus 2 by RT PCR: NEGATIVE

## 2021-09-14 LAB — CBC WITH DIFFERENTIAL/PLATELET
Abs Immature Granulocytes: 0.05 10*3/uL (ref 0.00–0.07)
Basophils Absolute: 0 10*3/uL (ref 0.0–0.1)
Basophils Relative: 0 %
Eosinophils Absolute: 0 10*3/uL (ref 0.0–0.5)
Eosinophils Relative: 0 %
HCT: 36.6 % — ABNORMAL LOW (ref 39.0–52.0)
Hemoglobin: 11.1 g/dL — ABNORMAL LOW (ref 13.0–17.0)
Immature Granulocytes: 1 %
Lymphocytes Relative: 7 %
Lymphs Abs: 0.7 10*3/uL (ref 0.7–4.0)
MCH: 23 pg — ABNORMAL LOW (ref 26.0–34.0)
MCHC: 30.3 g/dL (ref 30.0–36.0)
MCV: 75.8 fL — ABNORMAL LOW (ref 80.0–100.0)
Monocytes Absolute: 0.7 10*3/uL (ref 0.1–1.0)
Monocytes Relative: 6 %
Neutro Abs: 9.2 10*3/uL — ABNORMAL HIGH (ref 1.7–7.7)
Neutrophils Relative %: 86 %
Platelets: 205 10*3/uL (ref 150–400)
RBC: 4.83 MIL/uL (ref 4.22–5.81)
RDW: 17.2 % — ABNORMAL HIGH (ref 11.5–15.5)
WBC: 10.6 10*3/uL — ABNORMAL HIGH (ref 4.0–10.5)
nRBC: 0 % (ref 0.0–0.2)

## 2021-09-14 LAB — GLUCOSE, CAPILLARY: Glucose-Capillary: 207 mg/dL — ABNORMAL HIGH (ref 70–99)

## 2021-09-14 LAB — LIPASE, BLOOD: Lipase: 48 U/L (ref 11–51)

## 2021-09-14 LAB — CBG MONITORING, ED: Glucose-Capillary: 222 mg/dL — ABNORMAL HIGH (ref 70–99)

## 2021-09-14 MED ORDER — MORPHINE SULFATE (PF) 2 MG/ML IV SOLN
2.0000 mg | INTRAVENOUS | Status: DC | PRN
  Administered 2021-09-14 – 2021-09-15 (×6): 2 mg via INTRAVENOUS
  Filled 2021-09-14 (×6): qty 1

## 2021-09-14 MED ORDER — LACTATED RINGERS IV SOLN: INTRAVENOUS | Status: DC

## 2021-09-14 MED ORDER — ENOXAPARIN SODIUM 40 MG/0.4ML IJ SOSY
40.0000 mg | PREFILLED_SYRINGE | INTRAMUSCULAR | Status: DC
  Administered 2021-09-15 – 2021-09-16 (×2): 40 mg via SUBCUTANEOUS
  Filled 2021-09-14 (×4): qty 0.4

## 2021-09-14 MED ORDER — INSULIN ASPART 100 UNIT/ML IJ SOLN
0.0000 [IU] | INTRAMUSCULAR | Status: DC
  Filled 2021-09-14: qty 1

## 2021-09-14 MED ORDER — ONDANSETRON HCL 4 MG/2ML IJ SOLN
4.0000 mg | Freq: Once | INTRAMUSCULAR | Status: AC
  Administered 2021-09-14: 4 mg via INTRAVENOUS
  Filled 2021-09-14: qty 2

## 2021-09-14 MED ORDER — HYDROMORPHONE HCL 1 MG/ML IJ SOLN
1.0000 mg | Freq: Once | INTRAMUSCULAR | Status: AC
  Administered 2021-09-14: 1 mg via INTRAVENOUS
  Filled 2021-09-14: qty 1

## 2021-09-14 MED ORDER — SODIUM CHLORIDE 0.9 % IV BOLUS
1000.0000 mL | Freq: Once | INTRAVENOUS | Status: AC
  Administered 2021-09-14: 1000 mL via INTRAVENOUS

## 2021-09-14 MED ORDER — ACETAMINOPHEN 650 MG RE SUPP: 650.0000 mg | Freq: Four times a day (QID) | RECTAL | Status: DC | PRN

## 2021-09-14 MED ORDER — MORPHINE SULFATE (PF) 4 MG/ML IV SOLN
4.0000 mg | Freq: Once | INTRAVENOUS | Status: AC
Start: 2021-09-14 — End: 2021-09-14
  Administered 2021-09-14: 4 mg via INTRAVENOUS
  Filled 2021-09-14: qty 1

## 2021-09-14 MED ORDER — ACETAMINOPHEN 325 MG PO TABS: 650.0000 mg | ORAL_TABLET | Freq: Four times a day (QID) | ORAL | Status: DC | PRN

## 2021-09-14 MED ORDER — ONDANSETRON HCL 4 MG PO TABS
4.0000 mg | ORAL_TABLET | Freq: Four times a day (QID) | ORAL | Status: DC | PRN
Start: 2021-09-14 — End: 2021-09-17

## 2021-09-14 MED ORDER — IOHEXOL 300 MG/ML  SOLN
100.0000 mL | Freq: Once | INTRAMUSCULAR | Status: AC | PRN
  Administered 2021-09-14: 100 mL via INTRAVENOUS

## 2021-09-14 MED ORDER — ONDANSETRON HCL 4 MG/2ML IJ SOLN
4.0000 mg | Freq: Four times a day (QID) | INTRAMUSCULAR | Status: DC | PRN
  Administered 2021-09-15 (×2): 4 mg via INTRAVENOUS
  Filled 2021-09-14 (×2): qty 2

## 2021-09-14 NOTE — ED Provider Notes (Signed)
? ?[facility] ?Provider Note ? ? ? Event Date/Time  ? First MD Initiated Contact with Patient 09/14/21 1747   ?  (approximate) ? ? ?History  ? ?Abdominal Pain ? ? ?HPI ? ?Timothy Hancock is a 56 y.o. male  pmh DM, SBO in 2015, s/p appendectomy, cholecystectomy presenting with abdominal pain. Pain is located in L side. Started yesterday AM. Denies dysuria.  Last BM was yesterday. Has had nausea and vomiting. Has had cold sweats.  Has history of prior bowel obstruction and says this feels similar.  Denies urinary symptoms. ? ?  ? ?Past Medical History:  ?Diagnosis Date  ? Diabetes (HCC)   ? ? ?Patient Active Problem List  ? Diagnosis Date Noted  ? Enteritis   ? SBO (small bowel obstruction) (HCC)   ? ? ? ?Physical Exam  ?Triage Vital Signs: ?ED Triage Vitals  ?Enc Vitals Group  ?   BP 09/14/21 1753 137/78  ?   Pulse Rate 09/14/21 1753 99  ?   Resp 09/14/21 1753 20  ?   Temp 09/14/21 1753 98.7 ?F (37.1 ?C)  ?   Temp Source 09/14/21 1753 Oral  ?   SpO2 09/14/21 1753 98 %  ?   Weight 09/14/21 1752 184 lb (83.5 kg)  ?   Height 09/14/21 1752 5\' 11"  (1.803 m)  ?   Head Circumference --   ?   Peak Flow --   ?   Pain Score 09/14/21 1752 9  ?   Pain Loc --   ?   Pain Edu? --   ?   Excl. in GC? --   ? ? ?Most recent vital signs: ?Vitals:  ? 09/14/21 1753 09/14/21 1830  ?BP: 137/78 (!) 141/79  ?Pulse: 99 67  ?Resp: 20 20  ?Temp: 98.7 ?F (37.1 ?C)   ?SpO2: 98% 98%  ? ? ? ?General: Awake, appears uncomfortable ?CV:  Good peripheral perfusion.  ?Resp:  Normal effort.  ?Abd:  No distention.  Abdomen is tender primarily in the left lower quadrant without guarding ?Neuro:             Awake, Alert, Oriented x 3  ?Other:   ? ? ?ED Results / Procedures / Treatments  ?Labs ?(all labs ordered are listed, but only abnormal results are displayed) ?Labs Reviewed  ?COMPREHENSIVE METABOLIC PANEL - Abnormal; Notable for the following components:  ?    Result Value  ? Glucose, Bld 274 (*)   ? All other components within  normal limits  ?CBC WITH DIFFERENTIAL/PLATELET - Abnormal; Notable for the following components:  ? WBC 10.6 (*)   ? Hemoglobin 11.1 (*)   ? HCT 36.6 (*)   ? MCV 75.8 (*)   ? MCH 23.0 (*)   ? RDW 17.2 (*)   ? Neutro Abs 9.2 (*)   ? All other components within normal limits  ?CBG MONITORING, ED - Abnormal; Notable for the following components:  ? Glucose-Capillary 222 (*)   ? All other components within normal limits  ?RESP PANEL BY RT-PCR (FLU A&B, COVID) [id number]  ?LIPASE, BLOOD  ? ? ? ?EKG ? ? ? ? ?RADIOLOGY ?Reviewed the patient's CT abdomen which shows a partial bowel obstruction ? ? ?PROCEDURES: ? ?Critical Care performed: No ? ?.1-3 Lead EKG Interpretation ?Performed by: 09/16/21, MD ?Authorized by: Georga Hacking, MD  ? ?  Interpretation: normal   ?  ECG rate assessment: normal   ?  Ectopy: none   ? ?  The patient is on the cardiac monitor to evaluate for evidence of arrhythmia and/or significant heart rate changes. ? ? ?MEDICATIONS ORDERED IN ED: ?Medications  ?HYDROmorphone (DILAUDID) injection 1 mg (has no administration in time range)  ?ondansetron (ZOFRAN) injection 4 mg (has no administration in time range)  ?lactated ringers infusion (has no administration in time range)  ?sodium chloride 0.9 % bolus 1,000 mL (0 mLs Intravenous Stopped 09/14/21 1958)  ?ondansetron [facility]) injection 4 mg (4 mg Intravenous Given 09/14/21 1835)  ?morphine (PF) 4 MG/ML injection 4 mg (4 mg Intravenous Given 09/14/21 1837)  ?iohexol (OMNIPAQUE) 300 MG/ML solution 100 mL (100 mLs Intravenous Contrast Given 09/14/21 1919)  ? ? ? ?IMPRESSION / MDM / ASSESSMENT AND PLAN / ED COURSE  ?I reviewed the triage vital signs and the nursing notes. ?             ?               ? ?Differential diagnosis includes, but is not limited to, SBO, ileus, pancreatitis, less likely biliary, diverticulitis, enteritis, gastroenteritis ? ?Is a 56 year old male with multiple prior abdominal surgeries and prior SBO presents with nausea  vomiting abdominal pain.  Last BM was yesterday.  Has had multiple episodes of emesis today pain primarily in the left side of his abdomen.  Does appear somewhat uncomfortable on exam but is nontoxic.  Abdomen is tender primarily on the left but is not rigid and not peritoneal.  Reviewed his labs which are notable for mild leukocytosis, as well as hyperglycemia to 274 but no evidence of DKA.  Lipase is negative.  CT abdomen pelvis obtained which shows likely an early versus partial SBO without definite transition point.  Patient given IV opioids and fluids.  Still having some nausea and vomiting after 1 dose of IV Zofran so we will give a repeat dose.  Will admit to the hospitalist service. ? ?  ? ? ?FINAL CLINICAL IMPRESSION(S) / ED DIAGNOSES  ? ?Final diagnoses:  ?SBO (small bowel obstruction) (HCC)  ? ? ? ?Rx / DC Orders  ? ?ED Discharge Orders   ? ? None  ? ?  ? ? ? ?Note:  This document was prepared using Dragon voice recognition software and may include unintentional dictation errors. ?  ?Georga Hacking, MD ?09/14/21 2019 ? ?

## 2021-09-14 NOTE — Assessment & Plan Note (Addendum)
Hemoglobin at baseline. ?Monitor CBC in follow-up. ?

## 2021-09-14 NOTE — Assessment & Plan Note (Addendum)
Covered with sliding scale insulin during admission. ?Resumed home regimen once PO resumed. ? ?A1c 7.2%. ?PCP follow up. ?

## 2021-09-14 NOTE — Assessment & Plan Note (Addendum)
General surgery consulted. ?Started on IV fluids and IV antiemetics as needed. ?NG tube was placed to suction for gastric decompression. ? ?NG tube clamped yesterday morning 3/23,  started on clear liquids and tolerated well.  Pt had BM. ?NG tube removed yesterday afternoon. ? ?3/24: Patient's diet was advanced to soft this morning by surgery.  He is doing well, tolerating diet, passing gas and stool.  Ileus/partial obstruction appears resolved. ? ?Case discussed with general surgery and patient is clinically improved and stable for discharge.  Follow-up with surgery only if needed. ? ?? ? ?

## 2021-09-14 NOTE — Plan of Care (Signed)

## 2021-09-14 NOTE — ED Triage Notes (Signed)
From home via [facility]. Abdominal pain starting yesterday, unable to keep anything down since lastnight. LLQ/midline.  Hx of blockages, takes antiacids at home.  No diarrhea.  ?Pt has had appendix removed. ? ?139/77 ?101.5 oral ?HR in 60s ?RR 18 unlabored ? ? fentanyl IV ?4mg  zofran IV ? 0.9% NS ?

## 2021-09-14 NOTE — H&P (Addendum)
?History and Physical  ? ? ?Patient: Timothy Hancock [id number]:[id number] DOB: Mar 16, 1966 ?DOA: 09/14/2021 ?DOS: the patient was seen and examined on 09/14/2021 ?PCP: Pcp, No  ?Patient coming from: Home ? ?Chief Complaint:  ?Chief Complaint  ?Patient presents with  ? Abdominal Pain  ? ? ?HPI: Luisfernando Kalif Kattner is a 56 y.o. male with medical history significant for DM, chronic anemia, prior appendectomy and cholecystectomy who presents to the ED with a 1 day history of left lower quadrant pain associated with multiple episodes of nonbloody nonbilious emesis.  He denies fever or chills.  Denies diarrhea or dysuria.  Has no affected contacts. ?ED course: On arrival, vitals within normal limits.  Blood work significant for leukocytosis of 10,600, stable hemoglobin of 11.1.  Lipase and LFTs within normal limits.  Blood glucose 274.  CT abdomen and pelvis showing the following findings: ?IMPRESSION: ?1. Small bowel findings suggestive of early/partial small bowel ?obstruction versus less likely ileus. No definite transition point ?suggest complete bowel obstruction ? ?Patient was treated with an IV fluid bolus, Dilaudid, morphine and Zofran.  The ED provider consulted surgery recommended conservative management.  Hospitalist consulted for admission.  ? ?Review of Systems: As mentioned in the history of present illness. All other systems reviewed and are negative. ?Past Medical History:  ?Diagnosis Date  ? Diabetes ([place])   ? ?Past Surgical History:  ?Procedure Laterality Date  ? APPENDECTOMY    ? CHOLECYSTECTOMY    ? ?Social History:  reports that he has never smoked. He has never used smokeless tobacco. He reports that he does not drink alcohol and does not use drugs. ? ?No Known Allergies ? ?Family History  ?Problem Relation Age of Onset  ? Diabetes Mellitus II Mother   ? Diabetes Mellitus II Father   ? ? ?Prior to Admission medications   ?Medication Sig Start Date End Date Taking? Authorizing Provider  ?blood glucose meter  kit and supplies Dispense based on patient and insurance preference. Use up to four times daily as directed. (FOR ICD-10 E10.9, E11.9). 08/31/19   Cletis Athens, MD  ?glimepiride (AMARYL) 2 MG tablet Take 1 tablet (2 mg total) by mouth 2 (two) times daily at 10 AM and 5 PM. 07/11/20   Wenda Low, MD  ?glucose blood (FREESTYLE TEST STRIPS) test strip Use as instructed 08/31/19 08/29/20  Cletis Athens, MD  ?omeprazole (PRILOSEC) 10 MG capsule Take 4 capsules (40 mg total) by mouth once for 1 dose. 08/31/19 08/31/19  Cletis Athens, MD  ?sitaGLIPtin-metformin (JANUMET) 50-1000 MG tablet Cletis Athens 08/31/19 12/21/19  Cletis Athens, MD  ? ? ?Physical Exam: ?Vitals:  ? 09/14/21 1752 09/14/21 1753 09/14/21 1830 09/14/21 2038  ?BP:  137/78 (!) 141/79 (!) 155/80  ?Pulse:  99 67 91  ?Resp:  '20 20 20  ' ?Temp:  98.7 ?F (37.1 ?C)    ?TempSrc:  Oral    ?SpO2:  98% 98% 98%  ?Weight: 83.5 kg     ?Height: '5\' 11"'  (1.803 m)     ? ?Physical Exam ?Vitals and nursing note reviewed.  ?Constitutional:   ?   General: He is not in acute distress. ?HENT:  ?   Head: Normocephalic and atraumatic.  ?Cardiovascular:  ?   Rate and Rhythm: Normal rate and regular rhythm.  ?   Pulses: Normal pulses.  ?   Heart sounds: Normal heart sounds.  ?Pulmonary:  ?   Effort: Pulmonary effort is normal.  ?   Breath sounds: Normal breath sounds.  ?Abdominal:  ?  Palpations: Abdomen is soft.  ?   Tenderness: There is abdominal tenderness in the left lower quadrant.  ?Neurological:  ?   Mental Status: Mental status is at baseline.  ? ? ? ?Data Reviewed: ?Relevant notes from primary care and specialist visits, past discharge summaries as available in EHR, including Care Everywhere. ?Prior diagnostic testing as pertinent to current admission diagnoses ?Updated medications and problem lists for reconciliation ?ED course, including vitals, labs, imaging, treatment and response to treatment ?Triage notes, nursing and pharmacy notes and ED provider's notes ?Notable results as  noted in HPI ? ? ?Assessment and Plan: ?* Partial small bowel obstruction (HCC) ?N.p.o. ?We will place NG tube if recurrence of vomiting ?IV hydration, IV antiemetics and IV pain control ?Surgical consult to follow ? ? ?History of abdominal surgery ?Prior appendectomy and cholecystectomy ? ?Chronic anemia ?Hemoglobin 11.1 which is at baseline ? ?Uncontrolled type 2 diabetes mellitus with hyperglycemia, without long-term current use of insulin ([place]) ?Sliding scale insulin coverage ? ? ? ? ? ? ?Advance Care Planning:   Code Status: Prior  ? ?Consults: Surgery, Dr. Windell Moment ? ?Family Communication: none ? ?Severity of Illness: ?The appropriate patient status for this patient is INPATIENT. Inpatient status is judged to be reasonable and necessary in order to provide the required intensity of service to ensure the patient's safety. The patient's presenting symptoms, physical exam findings, and initial radiographic and laboratory data in the context of their chronic comorbidities is felt to place them at high risk for further clinical deterioration. Furthermore, it is not anticipated that the patient will be medically stable for discharge from the hospital within 2 midnights of admission.  ? ?* I certify that at the point of admission it is my clinical judgment that the patient will require inpatient hospital care spanning beyond 2 midnights from the point of admission due to high intensity of service, high risk for further deterioration and high frequency of surveillance required.* ? ?Author: ?Athena Masse, MD ?09/14/2021 9:21 PM ? ?For on call review [email or web address].  ?

## 2021-09-15 ENCOUNTER — Inpatient Hospital Stay: Payer: Self-pay

## 2021-09-15 DIAGNOSIS — Z9889 Other specified postprocedural states: Secondary | ICD-10-CM

## 2021-09-15 LAB — BASIC METABOLIC PANEL
Anion gap: 6 (ref 5–15)
BUN: 22 mg/dL — ABNORMAL HIGH (ref 6–20)
CO2: 28 mmol/L (ref 22–32)
Calcium: 8.4 mg/dL — ABNORMAL LOW (ref 8.9–10.3)
Chloride: 106 mmol/L (ref 98–111)
Creatinine, Ser: 0.97 mg/dL (ref 0.61–1.24)
GFR, Estimated: >60 mL/min (ref >60)
Glucose, Bld: 245 mg/dL — ABNORMAL HIGH (ref 70–99)
Potassium: 4 mmol/L (ref 3.5–5.1)
Sodium: 140 mmol/L (ref 135–145)

## 2021-09-15 LAB — CBC
HCT: 32.1 % — ABNORMAL LOW (ref 39.0–52.0)
Hemoglobin: 9.7 g/dL — ABNORMAL LOW (ref 13.0–17.0)
MCH: 22.9 pg — ABNORMAL LOW (ref 26.0–34.0)
MCHC: 30.2 g/dL (ref 30.0–36.0)
MCV: 75.7 fL — ABNORMAL LOW (ref 80.0–100.0)
Platelets: 178 10*3/uL (ref 150–400)
RBC: 4.24 MIL/uL (ref 4.22–5.81)
RDW: 17.2 % — ABNORMAL HIGH (ref 11.5–15.5)
WBC: 8.5 10*3/uL (ref 4.0–10.5)
nRBC: 0 % (ref 0.0–0.2)

## 2021-09-15 LAB — GLUCOSE, CAPILLARY
Glucose-Capillary: 145 mg/dL — ABNORMAL HIGH (ref 70–99)
Glucose-Capillary: 148 mg/dL — ABNORMAL HIGH (ref 70–99)
Glucose-Capillary: 165 mg/dL — ABNORMAL HIGH (ref 70–99)
Glucose-Capillary: 218 mg/dL — ABNORMAL HIGH (ref 70–99)
Glucose-Capillary: 222 mg/dL — ABNORMAL HIGH (ref 70–99)
Glucose-Capillary: 224 mg/dL — ABNORMAL HIGH (ref 70–99)
Glucose-Capillary: 241 mg/dL — ABNORMAL HIGH (ref 70–99)

## 2021-09-15 LAB — HIV ANTIBODY (ROUTINE TESTING W REFLEX): HIV Screen 4th Generation wRfx: NONREACTIVE

## 2021-09-15 MED ORDER — MORPHINE SULFATE (PF) 4 MG/ML IV SOLN
4.0000 mg | INTRAVENOUS | Status: DC | PRN
  Administered 2021-09-15 – 2021-09-16 (×5): 4 mg via INTRAVENOUS
  Filled 2021-09-15 (×5): qty 1

## 2021-09-15 MED ORDER — PANTOPRAZOLE SODIUM 40 MG IV SOLR
40.0000 mg | INTRAVENOUS | Status: DC
  Administered 2021-09-15 – 2021-09-17 (×3): 40 mg via INTRAVENOUS
  Filled 2021-09-15 (×3): qty 10

## 2021-09-15 MED ORDER — DIATRIZOATE MEGLUMINE & SODIUM 66-10 % PO SOLN
90.0000 mL | Freq: Once | ORAL | Status: AC
  Administered 2021-09-15: 90 mL via NASOGASTRIC

## 2021-09-15 NOTE — Progress Notes (Signed)
Patient connected to LIWS @ 80. Patient educated on POC and LIWS. ?

## 2021-09-15 NOTE — Progress Notes (Signed)
?  Progress Note ? ? ?Patient: Timothy Hancock GQQ:[id number] DOB: December 16, 1965 DOA: 09/14/2021     1 ?DOS: the patient was seen and examined on 09/15/2021 ?  ?Brief hospital course: ?Abednego Reiner Loewen is a 56 y.o. male with medical history significant for DM, chronic anemia, prior appendectomy and cholecystectomy who presents to the ED with a 1 day history of left lower quadrant pain associated with multiple episodes of nonbloody nonbilious emesis. CT abdomen and pelvis with small bowel findings suggestive of early/partial SBO versus less likely ileus. No definite transition point suggest complete obstruction. ?  ?Patient was treated with an IV fluid bolus, Dilaudid, morphine and Zofran.  The ED provider consulted surgery, they recommended conservative management. ?Patient admitted to hospital.  NG tube was placed to suction for decompression. ? ? ?Assessment and Plan: ?* Partial small bowel obstruction (HCC) ?General surgery following ?NPO ?NG tube to suction for decompression ?IV hydration ?V antiemetics IV pain control PRN ? ? ?History of abdominal surgery ?Prior appendectomy and cholecystectomy ? ?Chronic anemia ?Hemoglobin 11.1 which is at baseline ? ?Uncontrolled type 2 diabetes mellitus with hyperglycemia, without long-term current use of insulin (HCC) ?Sliding scale Novolog  ?Check A1c with AM labs ? ? ? ? ?  ? ?Subjective: Patient awake sitting up in bed when seen this morning.  He reports being miserable, saying he is "about to give up".  He reports serious abdominal pain and ongoing nausea.  He is not sure that NG tube is working properly.  He is asking for pain medication for 10 out of 10 abdominal pain diffuse. ? ?Physical Exam: ?Vitals:  ? 09/14/21 2038 09/14/21 2214 09/15/21 0349 09/15/21 0758  ?BP: (!) 155/80 (!) 145/78 140/69 (!) 148/85  ?Pulse: 91 80 92 76  ?Resp: 20 16 16 18   ?Temp:  98.1 ?F (36.7 ?C) 98.4 ?F (36.9 ?C) 98 ?F (36.7 ?C)  ?TempSrc:  Oral Oral Oral  ?SpO2: 98% 97% 97% 98%  ?Weight:       ?Height:      ? ?General exam: awake, alert, appears in mild distress due to pain ?HEENT: NG tube in right nare connected to wall suction moist mucus membranes, hearing grossly normal  ?Respiratory system: CTAB, no wheezes, rales or rhonchi, normal respiratory effort. ?Cardiovascular system: normal S1/S2, RRR, no pedal edema.   ?Gastrointestinal system: Abdomen is soft, diffusely tender with voluntary guarding, no rebound tenderness, unable to appreciate bowel sounds. ?Central nervous system: A&O x3. no gross focal neurologic deficits, normal speech ?Extremities: moves all, no edema, normal tone ?Skin: dry, intact, normal temperature ?Psychiatry: normal mood, congruent affect, judgement and insight appear normal ? ? ? ?Data Reviewed: ? ?Labs reviewed and notable for CBGs 218, 222.  BMP with glucose 245, BUN 22, calcium 8.4.  CBC with hemoglobin 9.7 ? ?Family Communication: None at bedside, will attempt to call ? ?Disposition: ?Status is: Inpatient ?Remains inpatient appropriate because: Remains with NG tube in place to suction with ongoing obstructive symptoms ? ? Planned Discharge Destination: Home ? ? ? ?Time spent: 35 minutes ? ?Author: ? , DO ?09/15/2021 1:18 PM ? ?For on call review [email or web address].  ?

## 2021-09-15 NOTE — Progress Notes (Signed)
NGT advanced 5cm to a reading of 60cm on the NGT.  ?

## 2021-09-15 NOTE — Plan of Care (Signed)
?  Problem: Education: ?Goal: Knowledge of General Education information will improve ?Description: Including pain rating scale, medication(s)/side effects and non-pharmacologic comfort measures ?Outcome: Progressing ?  ?Problem: Clinical Measurements: ?Goal: Will remain free from infection ?Outcome: Progressing ?  ?Problem: Clinical Measurements: ?Goal: Respiratory complications will improve ?Outcome: Progressing ?  ?Problem: Clinical Measurements: ?Goal: Cardiovascular complication will be avoided ?Outcome: Progressing ?  ?Problem: Activity: ?Goal: Risk for activity intolerance will decrease ?Outcome: Progressing ?  ?Problem: Pain Managment: ?Goal: General experience of comfort will improve ?Outcome: Progressing ?  ?Problem: Safety: ?Goal: Ability to remain free from injury will improve ?Outcome: Progressing ?  ?

## 2021-09-15 NOTE — Assessment & Plan Note (Signed)
Prior appendectomy and cholecystectomy ?

## 2021-09-15 NOTE — Consult Note (Signed)
SURGICAL CONSULTATION NOTE  ? ?HISTORY OF PRESENT ILLNESS (HPI):  ?56 y.o. male presented to [facility] ED for evaluation of abdominal pain. Patient reports patient endorses starting having abdominal pain 2 days ago.  Pain mostly on the left side of the abdomen.  Patient endorses associated nausea and vomiting.  There is no significant pain radiation.  Patient cannot identify any alleviating or aggravating factors.  Patient endorses having last bowel movement 2 days ago.  Today she denies passing gas. ? ?At the ED he was found with mild pain on the left side of the abdomen.  Labs shows normal white blood cell count with chronic anemia.  There is adequately renal function.  No significant electrolyte disturbance.  There is elevated glucose levels over 200.  I personally evaluated the chart and the last hemoglobin A1c on October 2022 was 11.1, highest in the last 3 years.  He had a CT scan of the abdomen and pelvis that shows mild small bowel dilation without any specific transition point.  There is no free air or free fluid.  I personally evaluated the images.  There was concern for ileus versus small bowel obstruction.  As per chart review patient had previous similar episodes of small bowel obstruction versus enteritis versus ileus that resolved with medical management.  D was done February 2017.  Images and clinical scenario are similar to this episode. ? ?Surgery is consulted by Dr. Damita Dunnings in this context for evaluation and management of suspected small bowel obstruction.  ? ?PAST MEDICAL HISTORY (PMH):  ?Past Medical History:  ?Diagnosis Date  ? Diabetes ([place])   ?  ? ?PAST SURGICAL HISTORY ([place]):  ?Past Surgical History:  ?Procedure Laterality Date  ? APPENDECTOMY    ? CHOLECYSTECTOMY    ?  ? ?MEDICATIONS:  ?Prior to Admission medications   ?Medication Sig Start Date End Date Taking? Authorizing Provider  ?blood glucose meter kit and supplies Dispense based on patient and insurance preference. Use up to four times  daily as directed. (FOR ICD-10 E10.9, E11.9). 08/31/19   Cletis Athens, MD  ?glimepiride (AMARYL) 2 MG tablet Take 1 tablet (2 mg total) by mouth 2 (two) times daily at 10 AM and 5 PM. 07/11/20   Wenda Low, MD  ?glucose blood (FREESTYLE TEST STRIPS) test strip Use as instructed 08/31/19 08/29/20  Cletis Athens, MD  ?omeprazole (PRILOSEC) 10 MG capsule Take 4 capsules (40 mg total) by mouth once for 1 dose. 08/31/19 08/31/19  Cletis Athens, MD  ?sitaGLIPtin-metformin (JANUMET) 50-1000 MG tablet Cletis Athens 08/31/19 12/21/19  Cletis Athens, MD  ?  ? ?ALLERGIES:  ?Allergies  ?Allergen Reactions  ? Pork-Derived Products   ?  ? ?SOCIAL HISTORY:  ?Social History  ? ?Socioeconomic History  ? Marital status: Married  ?  Spouse name: Not on file  ? Number of children: Not on file  ? Years of education: Not on file  ? Highest education level: Not on file  ?Occupational History  ? Not on file  ?Tobacco Use  ? Smoking status: Never  ? Smokeless tobacco: Never  ?Substance and Sexual Activity  ? Alcohol use: No  ? Drug use: Never  ? Sexual activity: Not on file  ?Other Topics Concern  ? Not on file  ?Social History Narrative  ? Not on file  ? ?Social Determinants of Health  ? ?Financial Resource Strain: Not on file  ?Food Insecurity: Not on file  ?Transportation Needs: Not on file  ?Physical Activity: Not on file  ?Stress:  Not on file  ?Social Connections: Not on file  ?Intimate Partner Violence: Not on file  ?  ? ? ?FAMILY HISTORY:  ?Family History  ?Problem Relation Age of Onset  ? Diabetes Mellitus II Mother   ? Diabetes Mellitus II Father   ?  ? ?REVIEW OF SYSTEMS:  ?Constitutional: denies weight loss, fever, chills, or sweats  ?Eyes: denies any other vision changes, history of eye injury  ?ENT: denies sore throat, hearing problems  ?Respiratory: denies shortness of breath, wheezing  ?Cardiovascular: denies chest pain, palpitations  ?Gastrointestinal: Positive abdominal pain, nausea and vomiting ?Genitourinary: denies burning with  urination or urinary frequency ?Musculoskeletal: denies any other joint pains or cramps  ?Skin: denies any other rashes or skin discolorations  ?Neurological: denies any other headache, dizziness, weakness  ?Psychiatric: denies any other depression, anxiety  ? ?All other review of systems were negative  ? ?VITAL SIGNS:  ?Temp:  [98 ?F (36.7 ?C)-98.7 ?F (37.1 ?C)] 98 ?F (36.7 ?C) (03/22 2707) ?Pulse Rate:  [67-99] 76 (03/22 0758) ?Resp:  [16-20] 18 (03/22 0758) ?BP: (137-155)/(69-85) 148/85 (03/22 0758) ?SpO2:  [97 %-98 %] 98 % (03/22 0758) ?Weight:  [83.5 kg] 83.5 kg (03/21 1752)     Height: '5\' 11"'  (180.3 cm) Weight: 83.5 kg BMI (Calculated): 25.67  ? ?INTAKE/OUTPUT:  ?This shift: No intake/output data recorded.  ?Last 2 shifts: '@[email or web address]' @  ? ?PHYSICAL EXAM:  ?Constitutional:  ?-- Normal body habitus  ?-- Awake, alert, and oriented x3  ?Eyes:  ?-- Pupils equally round and reactive to light  ?-- No scleral icterus  ?Ear, nose, and throat:  ?-- No jugular venous distension  ?Pulmonary:  ?-- No crackles  ?-- Equal breath sounds bilaterally ?-- Breathing non-labored at rest ?Cardiovascular:  ?-- S1, S2 present  ?-- No pericardial rubs ?Gastrointestinal:  ?-- Abdomen soft, very mild tenderness in the lower abdomen, very mild-distended, no guarding or rebound tenderness ?-- No abdominal masses appreciated, pulsatile or otherwise  ?Musculoskeletal and Integumentary:  ?-- Wounds: None appreciated ?-- Extremities: B/L UE and LE FROM, hands and feet warm, no edema  ?Neurologic:  ?-- Motor function: intact and symmetric ?-- Sensation: intact and symmetric ? ? ?Labs:  ? ?  Latest Ref Rng & Units 09/15/2021  ?  4:16 AM 09/14/2021  ?  5:54 PM 04/13/2021  ? 10:33 AM  ?CBC  ?WBC 4.0 - 10.5 K/uL 8.5   10.6   5.6    ?Hemoglobin 13.0 - 17.0 g/dL 9.7   11.1   10.4    ?Hematocrit 39.0 - 52.0 % 32.1   36.6   36.0    ?Platelets 150 - 400 K/uL 178   205   160    ? ? ?  Latest Ref Rng & Units 09/15/2021  ?  4:16 AM 09/14/2021  ?  5:54 PM  04/13/2021  ? 10:33 AM  ?CMP  ?Glucose 70 - 99 mg/dL 245   274   358    ?BUN 6 - 20 mg/dL '22   19   18    ' ?Creatinine 0.61 - 1.24 mg/dL 0.97   1.05   1.00    ?Sodium 135 - 145 mmol/L 140   138   135    ?Potassium 3.5 - 5.1 mmol/L 4.0   4.0   4.5    ?Chloride 98 - 111 mmol/L 106   102   100    ?CO2 22 - 32 mmol/L '28   28   21    ' ?Calcium  8.9 - 10.3 mg/dL 8.4   9.0   9.2    ?Total Protein 6.5 - 8.1 g/dL  7.8   6.9    ?Total Bilirubin 0.3 - 1.2 mg/dL  1.1   0.5    ?Alkaline Phos 38 - 126 U/L  63   91    ?AST 15 - 41 U/L  15   12    ?ALT 0 - 44 U/L  15   16    ? ?Hemoglobin A1c October 2022: 11.1 ? ?Imaging studies:  ?EXAM: ?CT ABDOMEN AND PELVIS WITH CONTRAST ?  ?TECHNIQUE: ?Multidetector CT imaging of the abdomen and pelvis was performed ?using the standard protocol following bolus administration of ?intravenous contrast. ?  ?RADIATION DOSE REDUCTION: This exam was performed according to the ?departmental dose-optimization program which includes automated ?exposure control, adjustment of the mA and/or kV according to ?patient size and/or use of iterative reconstruction technique. ?  ?CONTRAST:  121m OMNIPAQUE IOHEXOL 300 MG/ML  SOLN ?  ?COMPARISON:  None. ?  ?FINDINGS: ?Lower chest: Bilateral lower lobe subsegmental atelectasis. No acute ?abnormality. ?  ?Hepatobiliary: No focal liver abnormality. Status post ?cholecystectomy. No biliary dilatation. ?  ?Pancreas: No focal lesion. Normal pancreatic contour. No surrounding ?inflammatory changes. No main pancreatic ductal dilatation. ?  ?Spleen: Normal in size without focal abnormality. ?  ?Adrenals/Urinary Tract: ?  ?No adrenal nodule bilaterally. ?  ?Bilateral kidneys enhance symmetrically. There is a 1.6 cm fluid ?density lesion within left kidney that likely represents a simple ?renal cyst. ?  ?No hydronephrosis. No hydroureter. ?  ?The urinary bladder is unremarkable. ?  ?Stomach/Bowel: Stomach is within normal limits. Proximal to mid ?small bowel dilatation with  fluid measuring up to 4 cm in caliber on ?axial imaging with no frank transition point. The distal small bowel ?is decompressed. No evidence of bowel wall thickening or dilatation. ?Status post appendectom

## 2021-09-15 NOTE — Hospital Course (Addendum)
Timothy Hancock is a 56 y.o. male with medical history significant for DM, chronic anemia, prior appendectomy and cholecystectomy who presents to the ED with a 1 day history of left lower quadrant pain associated with multiple episodes of nonbloody nonbilious emesis. CT abdomen and pelvis with small bowel findings suggestive of early/partial SBO versus less likely ileus. No definite transition point suggest complete obstruction. ?  ?Patient was treated with an IV fluid bolus, Dilaudid, morphine and Zofran.  The ED provider consulted surgery, they recommended conservative management. ?Patient admitted to hospital.   ?NG tube was placed to suction for decompression. ? ?

## 2021-09-16 LAB — GLUCOSE, CAPILLARY
Glucose-Capillary: 147 mg/dL — ABNORMAL HIGH (ref 70–99)
Glucose-Capillary: 141 mg/dL — ABNORMAL HIGH (ref 70–99)
Glucose-Capillary: 147 mg/dL — ABNORMAL HIGH (ref 70–99)
Glucose-Capillary: 156 mg/dL — ABNORMAL HIGH (ref 70–99)
Glucose-Capillary: 198 mg/dL — ABNORMAL HIGH (ref 70–99)

## 2021-09-16 LAB — BASIC METABOLIC PANEL
Anion gap: 5 (ref 5–15)
BUN: 24 mg/dL — ABNORMAL HIGH (ref 6–20)
CO2: 27 mmol/L (ref 22–32)
Calcium: 8.3 mg/dL — ABNORMAL LOW (ref 8.9–10.3)
Chloride: 108 mmol/L (ref 98–111)
Creatinine, Ser: 0.81 mg/dL (ref 0.61–1.24)
GFR, Estimated: >60 mL/min (ref >60)
Glucose, Bld: 156 mg/dL — ABNORMAL HIGH (ref 70–99)
Potassium: 3.9 mmol/L (ref 3.5–5.1)
Sodium: 140 mmol/L (ref 135–145)

## 2021-09-16 LAB — MAGNESIUM: Magnesium: 2.1 mg/dL (ref 1.7–2.4)

## 2021-09-16 MED ORDER — GLIMEPIRIDE 2 MG PO TABS
2.0000 mg | ORAL_TABLET | Freq: Two times a day (BID) | ORAL | Status: DC
  Administered 2021-09-16 – 2021-09-17 (×2): 2 mg via ORAL
  Filled 2021-09-16 (×2): qty 1

## 2021-09-16 MED ORDER — MORPHINE SULFATE (PF) 4 MG/ML IV SOLN
4.0000 mg | INTRAVENOUS | Status: DC | PRN
  Administered 2021-09-16: 4 mg via INTRAVENOUS
  Filled 2021-09-16: qty 1

## 2021-09-16 NOTE — Plan of Care (Signed)

## 2021-09-16 NOTE — Progress Notes (Signed)
Patient ID: Timothy Hancock, male   DOB: [birth year], 56 y.o.   MRN: [id number] ?    SURGICAL PROGRESS NOTE  ? ?Hospital Day(s): 2.  ? ?Interval History: Patient seen and examined, no acute events or new complaints overnight. Patient reports he is feeling a little bit better.  He still endorses that he is not passing gas or had any bowel movement.  He denies any nausea or vomiting this morning.  He endorses that his pain has been milder and localized to the lower abdomen. ? ?Vital signs in last 24 hours: [min-max] current  ?Temp:  [98.4 ?F (36.9 ?C)-98.9 ?F (37.2 ?C)] 98.4 ?F (36.9 ?C) (03/23 0086) ?Pulse Rate:  [71-90] 71 (03/23 0733) ?Resp:  [16-18] 16 (03/23 0733) ?BP: (129-140)/(69-80) 129/78 (03/23 7619) ?SpO2:  [97 %-98 %] 97 % (03/23 0733)     Height: 5\' 11"  (180.3 cm) Weight: 83.5 kg BMI (Calculated): 25.67  ? ?Physical Exam:  ?Constitutional: alert, cooperative and no distress  ?Respiratory: breathing non-labored at rest  ?Cardiovascular: regular rate and sinus rhythm  ?Gastrointestinal: soft, non-tender, and non-distended ? ?Labs:  ? ?  Latest Ref Rng & Units 09/15/2021  ?  4:16 AM 09/14/2021  ?  5:54 PM 04/13/2021  ? 10:33 AM  ?CBC  ?WBC 4.0 - 10.5 K/uL 8.5   10.6   5.6    ?Hemoglobin 13.0 - 17.0 g/dL 9.7   04/15/2021   50.9    ?Hematocrit 39.0 - 52.0 % 32.1   36.6   36.0    ?Platelets 150 - 400 K/uL 178   205   160    ? ? ?  Latest Ref Rng & Units 09/16/2021  ?  4:02 AM 09/15/2021  ?  4:16 AM 09/14/2021  ?  5:54 PM  ?CMP  ?Glucose 70 - 99 mg/dL 09/16/2021   712   458    ?BUN 6 - 20 mg/dL 24   22   19     ?Creatinine 0.61 - 1.24 mg/dL 099     8.33    ?Sodium 135 - 145 mmol/L 140   140   138    ?Potassium 3.5 - 5.1 mmol/L 3.9   4.0   4.0    ?Chloride 98 - 111 mmol/L 108   106   102    ?CO2 22 - 32 mmol/L 27   28   28     ?Calcium 8.9 - 10.3 mg/dL 8.3   8.4   9.0    ?Total Protein 6.5 - 8.1 g/dL   7.8    ?Total Bilirubin 0.3 - 1.2 mg/dL   1.1    ?Alkaline Phos 38 - 126 U/L   63    ?AST 15 - 41 U/L   15    ?ALT 0 - 44 U/L    15    ? ? ?Imaging studies:  ?EXAM: ?PORTABLE ABDOMEN - 1 VIEW ?  ?COMPARISON:  CT done on 09/14/2021 ?  ?FINDINGS: ?Tip enteric tube is seen in the fundus of the stomach. Oral contrast ?has reached the colon. There is no residual contrast in the small ?bowel loops. There are dilated small bowel loops in the left mid ?abdomen measuring up to 4.4 cm in diameter. There is thickening of ?mucosal folds in the dilated small bowel loops. ?  ?IMPRESSION: ?Oral contrast has reached the colon without any residual contrast in ?the small bowel loops. This finding makes possibility of significant ?small bowel obstruction unlikely. There is dilation of small-bowel ?  loops in the left mid abdomen. There is thickening of mucosal folds ?in the dilated small bowel loops. Findings suggest possible ileus ?due to enteritis. ?  ?  ?Electronically Signed ?  By: Ernie Avena M.D. ?  On: 09/15/2021 17:15 ? ? ?Assessment/Plan:  ?56 y.o. male with abdominal pain, nausea and vomiting with comorbidities such as uncontrolled diabetes mellitus type 2. ? ?Abdominal pain, nausea and vomiting: ?-Abdominal x-ray yesterday with Gastrografin shows the whole contrast travels to the large intestine in less than 8 hours.  I personally evaluated the images. ?-I agree with radiologist with his impression: "Oral contrast has reached the colon without any residual contrast in the small bowel loops. This finding makes possibility of significant ?small bowel obstruction unlikely. There is dilation of small-bowel ?loops in the left mid abdomen. There is thickening of mucosal folds ?in the dilated small bowel loops. Findings suggest possible ileus ?due to enteritis." ?-The fact that the oral contrast has traveled to the large intestine and listening hours makes small bowel obstruction less likely.  I recommend to look for other causes of the abdominal pain, nausea and vomiting/enteritis.  Patient had a similar episode in 2017 with same recommendation by  the surgeon at that time given, to consider other etiologies such as inflammatory processes. ?-I will clamp NGT and assess for toleration.  I will wait for the patient passing gas to start clear liquid diet.  If he does better today I will consider removing the NGT later. ?-Continue medical management of his diabetes, keep adequate hydration and electrolyte within normal limits ?-I encouraged the patient to ambulate ? ?Wilmon Arms, MD ? ? ? ?

## 2021-09-16 NOTE — Progress Notes (Deleted)
Pt is complaining of 8/10 pressure pain on left lower quadrant of Abdomen. Pt have a PRN morphine every 4 hours PRN was given at 2043. NP Foust made aware. Will continue to monitor. ?

## 2021-09-16 NOTE — Plan of Care (Signed)

## 2021-09-16 NOTE — Progress Notes (Signed)
?  Progress Note ? ? ?Patient: Timothy Hancock KKX:[id number] DOB: Apr 25, 1966 DOA: 09/14/2021     2 ?DOS: the patient was seen and examined on 09/16/2021 ?  ?Brief hospital course: ?Timothy Hancock is a 56 y.o. male with medical history significant for DM, chronic anemia, prior appendectomy and cholecystectomy who presents to the ED with a 1 day history of left lower quadrant pain associated with multiple episodes of nonbloody nonbilious emesis. CT abdomen and pelvis with small bowel findings suggestive of early/partial SBO versus less likely ileus. No definite transition point suggest complete obstruction. ?  ?Patient was treated with an IV fluid bolus, Dilaudid, morphine and Zofran.  The ED provider consulted surgery, they recommended conservative management. ?Patient admitted to hospital.  NG tube was placed to suction for decompression. ? ? ?Assessment and Plan: ?* Partial small bowel obstruction (HCC) ?General surgery following ?NG tube clamped this morning ?Patient had BM this morning ?NG tube to be removed ?Try on clear liquids ?IV hydration until adequate oral intake ?IV antiemetics and pain control PRN ? ?Gastrografin study 3/22 - Oral contrast has reached the colon without any residual contrast in the small bowel loops. This finding makes possibility of significant ?small bowel obstruction unlikely. There is dilation of small-bowel loops in the left mid abdomen. There is thickening of mucosal folds in the dilated small bowel loops. Findings suggest possible ileus due to enteritis. ?  ? ? ?History of abdominal surgery ?Prior appendectomy and cholecystectomy ? ?Chronic anemia ?Hemoglobin 11.1 which is at baseline ? ?Uncontrolled type 2 diabetes mellitus with hyperglycemia, without long-term current use of insulin (HCC) ?Sliding scale Novolog.  ?A1c pending. ?Resume home glimepiride, hold if made n.p.o. again or not consuming more than 50% of meal tray  ? ? ? ? ?  ? ?Subjective: patient seen up ambulating in  the hallway this morning.  When seen at bedside, he reports feeling better, had a bowel movement.  Abdominal pain is improved.  Denies nausea vomiting or any other acute complaints. ? ? ? ?Physical Exam: ?Vitals:  ? 09/15/21 0758 09/15/21 1630 09/15/21 2032 09/16/21 0733  ?BP: (!) 148/85 140/80 140/69 129/78  ?Pulse: 76 90 73 71  ?Resp: 18 18 18 16   ?Temp: 98 ?F (36.7 ?C) 98.7 ?F (37.1 ?C) 98.9 ?F (37.2 ?C) 98.4 ?F (36.9 ?C)  ?TempSrc: Oral Oral Oral Oral  ?SpO2: 98% 98% 97% 97%  ?Weight:      ?Height:      ? ?General exam: awake, alert, no acute distress ?HEENT: NG tube in right nare clamped moist mucus membranes, hearing grossly normal  ?Respiratory system: normal respiratory effort, on room air. ?Cardiovascular system: normal S1/S2, RRRno pedal edema.   ?Gastrointestinal system: soft, NT, ND, no HSM felt, +bowel sounds. ?Central nervous system: A&O x3. no gross focal neurologic deficits, normal speech ?Extremities: moves all, no edema, normal tone ?Psychiatry: normal mood, congruent affect, judgement and insight appear normal ? ? ? ?Data Reviewed: ? ?Labs reviewed and notable for glucose 156, BUN 24, calcium 8.3 otherwise unremarkable basic metabolic panel.  CBGs 147, 141, 147 ? ?Family Communication: None ? ?Disposition: ?Status is: Inpatient ?Remains inpatient appropriate because: Severity of illness with bowel obstruction which seems improving, advancing diet and expect discharge in 24 to 48 hours if further improvement tolerating diet and cleared by general surgery ? ? Planned Discharge Destination: Home ? ? ? ?Time spent: 35 minutes ? ?Author: ? , DO ?09/16/2021 2:16 PM ? ?For on call review [email or web address].  ?

## 2021-09-16 NOTE — TOC Initial Note (Signed)
Transition of Care (TOC) - Initial/Assessment Note  ? ? ?Patient Details  ?Name: Timothy Hancock ?MRN: [id number] ?Date of Birth: 06-18-1966 ? ?Transition of Care (TOC) CM/SW Contact:    ?Chapman Fitch, RN ?Phone Number: ?09/16/2021, 2:46 PM ? ?Clinical Narrative:                 ? ? ?Transition of Care (TOC) Screening Note ? ? ?Patient Details  ?Name: Timothy Hancock ?Date of Birth: Sep 21, 1965 ? ? ?Transition of Care (TOC) CM/SW Contact:    ?Chapman Fitch, RN ?Phone Number: ?09/16/2021, 2:46 PM ? ? ? ?Transition of Care Department [facility]) has reviewed patient and no TOC needs have been identified at this time. We will continue to monitor patient advancement through interdisciplinary progression rounds. If new patient transition needs arise, please place a TOC consult. ? ? ?  ? ? ?Patient Goals and CMS Choice ?  ?  ?  ? ?Expected Discharge Plan and Services ?  ?  ?  ?  ?  ?                ?  ?  ?  ?  ?  ?  ?  ?  ?  ?  ? ?Prior Living Arrangements/Services ?  ?  ?  ?       ?  ?  ?  ?  ? ?Activities of Daily Living ?Home Assistive Devices/Equipment: Dentures (specify type), Eyeglasses ?ADL Screening (condition at time of admission) ?Patient's cognitive ability adequate to safely complete daily activities?: Yes ?Is the patient deaf or have difficulty hearing?: No ?Does the patient have difficulty seeing, even when wearing glasses/contacts?: No ?Does the patient have difficulty concentrating, remembering, or making decisions?: No ?Patient able to express need for assistance with ADLs?: Yes ?Does the patient have difficulty dressing or bathing?: No ?Independently performs ADLs?: Yes (appropriate for developmental age) ?Does the patient have difficulty walking or climbing stairs?: No ?Weakness of Legs: None ?Weakness of Arms/Hands: None ? ?Permission Sought/Granted ?  ?  ?   ?   ?   ?   ? ?Emotional Assessment ?  ?  ?  ?  ?  ?  ? ?Admission diagnosis:  SBO (small bowel obstruction) (HCC) [K56.609] ?Partial small  bowel obstruction (HCC) [K56.600] ?Patient Active Problem List  ? Diagnosis Date Noted  ? History of abdominal surgery 09/15/2021  ? Partial small bowel obstruction (HCC) 09/14/2021  ? Uncontrolled type 2 diabetes mellitus with hyperglycemia, without long-term current use of insulin (HCC) 09/14/2021  ? Chronic anemia 09/14/2021  ? Enteritis   ? SBO (small bowel obstruction) (HCC)   ? ?PCP:  Pcp, No ?Pharmacy:   ?[facility] ?[address], [address] ?[place] [address] ?Phone: [phone] Fax: [phone] ? ?[facility] - [place], [place] - [address] ?[address] ?[place] [address] ?Phone: [phone] Fax: [phone] ? ? ? ? ?Social Determinants of Health (SDOH) Interventions ?  ? ?Readmission Risk Interventions ?   ? View : No data to display.  ?  ?  ?  ? ? ? ?

## 2021-09-17 ENCOUNTER — Encounter: Payer: Self-pay | Admitting: Internal Medicine

## 2021-09-17 LAB — HEMOGLOBIN A1C
Hgb A1c MFr Bld: 7.2 % — ABNORMAL HIGH (ref 4.8–5.6)
Mean Plasma Glucose: 160 mg/dL

## 2021-09-17 LAB — GLUCOSE, CAPILLARY
Glucose-Capillary: 189 mg/dL — ABNORMAL HIGH (ref 70–99)
Glucose-Capillary: 180 mg/dL — ABNORMAL HIGH (ref 70–99)

## 2021-09-17 NOTE — Discharge Summary (Signed)
?Physician Discharge Summary ?  ?Patient: Timothy Hancock MRN: [id number] DOB: 1965-11-15  ?Admit date:     09/14/2021  ?Discharge date: 09/17/21  ?Discharge Physician: Ezekiel Slocumb  ? ?PCP: Pcp, No  ? ?Recommendations at discharge:  ? ? Follow up with PCP in 1-2 weeks ?Follow up BMP/Mg/CBC in 1-2 weeks ?Only need surgical follow up if ongoing issues ? ?Discharge Diagnoses: ?Active Problems: ?  History of abdominal surgery ?  Uncontrolled type 2 diabetes mellitus with hyperglycemia, without long-term current use of insulin ([place]) ?  Chronic anemia ? ?Principal Problem (Resolved): ?  Partial small bowel obstruction ([place]) ? ?Hospital Course: ?Timothy Hancock is a 56 y.o. male with medical history significant for DM, chronic anemia, prior appendectomy and cholecystectomy who presents to the ED with a 1 day history of left lower quadrant pain associated with multiple episodes of nonbloody nonbilious emesis. CT abdomen and pelvis with small bowel findings suggestive of early/partial SBO versus less likely ileus. No definite transition point suggest complete obstruction. ?  ?Patient was treated with an IV fluid bolus, Dilaudid, morphine and Zofran.  The ED provider consulted surgery, they recommended conservative management. ?Patient admitted to hospital.   ?NG tube was placed to suction for decompression. ? ? ?Assessment and Plan: ?* Partial small bowel obstruction (HCC)-resolved as of 09/17/2021 ?General surgery consulted. ?Started on IV fluids and IV antiemetics as needed. ?NG tube was placed to suction for gastric decompression. ? ?NG tube clamped yesterday morning 3/23,  started on clear liquids and tolerated well.  Pt had BM. ?NG tube removed yesterday afternoon. ? ?3/24: Patient's diet was advanced to soft this morning by surgery.  He is doing well, tolerating diet, passing gas and stool.  Ileus/partial obstruction appears resolved. ? ?Case discussed with general surgery and patient is clinically improved and  stable for discharge.  Follow-up with surgery only if needed. ? ?  ? ? ?History of abdominal surgery ?Prior appendectomy and cholecystectomy ? ?Chronic anemia ?Hemoglobin at baseline. ?Monitor CBC in follow-up. ? ?Uncontrolled type 2 diabetes mellitus with hyperglycemia, without long-term current use of insulin ([place]) ?Covered with sliding scale insulin during admission. ?Resumed home regimen once PO resumed. ? ?A1c 7.2%. ?PCP follow up. ? ? ? ? ?  ? ? ?Consultants: Neurosurgery ?Procedures performed: NG tube placement, NG tube removal ? ?Disposition: Home ? ?Diet recommendation:  ?Full liquid diet or soft, advance as tolerated ? ?DISCHARGE MEDICATION: ?Allergies as of 09/17/2021   ? ?   Reactions  ? Pork-derived Products   ? ?  ? ?  ?Medication List  ?  ? ?TAKE these medications   ? ?blood glucose meter kit and supplies ?Dispense based on patient and insurance preference. Use up to four times daily as directed. (FOR ICD-10 E10.9, E11.9). ?  ?FREESTYLE TEST STRIPS test strip ?Generic drug: glucose blood ?Use as instructed ?  ?glimepiride 2 MG tablet ?Commonly known as: AMARYL ?Take 1 tablet (2 mg total) by mouth 2 (two) times daily at 10 AM and 5 PM. ?  ?sitaGLIPtin-metformin 50-1000 MG tablet ?Commonly known as: JANUMET ?Timothy Hancock ?  ? ?  ? ? ?Discharge Exam: ?Timothy Hancock Weights  ? 09/14/21 1752  ?Weight: 83.5 kg  ? ?General exam: awake, alert, no acute distress ?HEENT: moist mucus membranes, hearing grossly normal  ?Respiratory system: normal respiratory effort. ?Cardiovascular system: RRR, no pedal edema.   ?Gastrointestinal system: soft, NT, ND, no HSM felt, +bowel sounds. ?Central nervous system: A&O x3. no gross focal neurologic deficits,  normal speech ?Extremities: moves all, no edema, normal tone ?Psychiatry: normal mood, congruent affect, judgement and insight appear normal ? ? ?Condition at discharge: stable ? ?The results of significant diagnostics from this hospitalization (including imaging, microbiology,  ancillary and laboratory) are listed below for reference.  ? ?Imaging Studies: ?CT ABDOMEN PELVIS W CONTRAST ? ?Result Date: 09/14/2021 ?CLINICAL DATA:  Bowel obstruction suspected EXAM: CT ABDOMEN AND PELVIS WITH CONTRAST TECHNIQUE: Multidetector CT imaging of the abdomen and pelvis was performed using the standard protocol following bolus administration of intravenous contrast. RADIATION DOSE REDUCTION: This exam was performed according to the departmental dose-optimization program which includes automated exposure control, adjustment of the mA and/or kV according to patient size and/or use of iterative reconstruction technique. CONTRAST:  178m OMNIPAQUE IOHEXOL 300 MG/ML  SOLN COMPARISON:  None. FINDINGS: Lower chest: Bilateral lower lobe subsegmental atelectasis. No acute abnormality. Hepatobiliary: No focal liver abnormality. Status post cholecystectomy. No biliary dilatation. Pancreas: No focal lesion. Normal pancreatic contour. No surrounding inflammatory changes. No main pancreatic ductal dilatation. Spleen: Normal in size without focal abnormality. Adrenals/Urinary Tract: No adrenal nodule bilaterally. Bilateral kidneys enhance symmetrically. There is a 1.6 cm fluid density lesion within left kidney that likely represents a simple renal cyst. No hydronephrosis. No hydroureter. The urinary bladder is unremarkable. Stomach/Bowel: Stomach is within normal limits. Proximal to mid small bowel dilatation with fluid measuring up to 4 cm in caliber on axial imaging with no frank transition point. The distal small bowel is decompressed. No evidence of bowel wall thickening or dilatation. Status post appendectomy. Vascular/Lymphatic: No abdominal aorta or iliac aneurysm. Moderate atherosclerotic plaque of the aorta and its branches. No abdominal, pelvic, or inguinal lymphadenopathy. Reproductive: Prostate is unremarkable. Other: Trace free fluid within the right lower quadrant and pelvis. No intraperitoneal free gas.  No organized fluid collection. Musculoskeletal: Tiny fat containing umbilical hernia. No suspicious lytic or blastic osseous lesions. No acute displaced fracture. Multilevel degenerative changes of the spine. IMPRESSION: 1. Small bowel findings suggestive of early/partial small bowel obstruction versus less likely ileus. No definite transition point suggest complete bowel obstruction. 2.  Aortic Atherosclerosis (ICD10-I70.0). Electronically Signed   By: MIven FinnM.D.   On: 09/14/2021 19:54  ? ?DG Chest Port 1 View ? ?Result Date: 09/15/2021 ?CLINICAL DATA:  NG placement. EXAM: PORTABLE CHEST 1 VIEW COMPARISON:  Earlier radiograph dated 09/16/2018. FINDINGS: Enteric tube with tip in the left upper abdomen likely in the gastric fundus. No focal consolidation, pleural effusion, pneumothorax. The cardiac silhouette is within normal limits. Atherosclerotic calcification of the aortic arch. No acute osseous pathology. IMPRESSION: Enteric tube with tip in the gastric fundus. Electronically Signed   By: AAnner CreteM.D.   On: 09/15/2021 02:54  ? ?DG Chest Port 1 View ? ?Result Date: 09/15/2021 ?CLINICAL DATA:  Nasogastric tube placement EXAM: PORTABLE CHEST 1 VIEW COMPARISON:  12/14/2010 FINDINGS: The heart size and mediastinal contours are within normal limits. Both lungs are clear. The visualized skeletal structures are unremarkable. Side port of the nasogastric tube project in the stomach. The side port is just distal to the gastroesophageal junction and could be advanced by approximately 5 cm. IMPRESSION: Nasogastric tube side port in the stomach. Electronically Signed   By: KUlyses JarredM.D.   On: 09/15/2021 01:44  ? ?DG Abd Portable 1V-Small Bowel Obstruction Protocol-initial, 8 hr delay ? ?Result Date: 09/15/2021 ?CLINICAL DATA:  Small-bowel obstruction EXAM: PORTABLE ABDOMEN - 1 VIEW COMPARISON:  CT done on 09/14/2021 FINDINGS: Tip enteric tube is  seen in the fundus of the stomach. Oral contrast has  reached the colon. There is no residual contrast in the small bowel loops. There are dilated small bowel loops in the left mid abdomen measuring up to 4.4 cm in diameter. There is thickening of mucosal fol

## 2021-09-17 NOTE — TOC Transition Note (Signed)
Transition of Care (TOC) - CM/SW Discharge Note ? ? ?Patient Details  ?Name: Timothy Hancock ?MRN: [id number] ?Date of Birth: 10-20-1965 ? ?Transition of Care (TOC) CM/SW Contact:  ?Chapman Fitch, RN ?Phone Number: ?09/17/2021, 9:25 AM ? ? ?Clinical Narrative:    ? ? ?No medication needs at discharge ?Medication Management  and [facility]  application provided for discharge  ?  ?  ? ? ?Patient Goals and CMS Choice ?  ?  ?  ? ?Discharge Placement ?  ?           ?  ?  ?  ?  ? ?Discharge Plan and Services ?  ?  ?           ?  ?  ?  ?  ?  ?  ?  ?  ?  ?  ? ?Social Determinants of Health (SDOH) Interventions ?  ? ? ?Readmission Risk Interventions ?   ? View : No data to display.  ?  ?  ?  ? ? ? ? ? ?

## 2021-09-17 NOTE — Progress Notes (Signed)
Patient ID: Timothy Hancock, male   DOB: 09-26-1965, 56 y.o.   MRN: [id number] ?    SURGICAL PROGRESS NOTE  ? ?Hospital Day(s): 3.  ? ?Interval History: Patient seen and examined, no acute events or new complaints overnight. Patient reports feeling well.  Patient denies abdominal pain.  He endorses tolerating liquid diet.  He endorses having bowel movement and passing gas.  No nausea or vomiting. ? ?Vital signs in last 24 hours: [min-max] current  ?Temp:  [98.5 ?F (36.9 ?C)-98.6 ?F (37 ?C)] 98.6 ?F (37 ?C) (03/24 0405) ?Pulse Rate:  [65-83] 65 (03/24 0405) ?Resp:  [16-18] 16 (03/24 0405) ?BP: (124-138)/(66-67) 138/66 (03/24 0405) ?SpO2:  [98 %-99 %] 98 % (03/24 0405)     Height: 5\' 11"  (180.3 cm) Weight: 83.5 kg BMI (Calculated): 25.67  ? ?Physical Exam:  ?Constitutional: alert, cooperative and no distress  ?Respiratory: breathing non-labored at rest  ?Cardiovascular: regular rate and sinus rhythm  ?Gastrointestinal: soft, non-tender, and non-distended ? ?Labs:  ? ?  Latest Ref Rng & Units 09/15/2021  ?  4:16 AM 09/14/2021  ?  5:54 PM 04/13/2021  ? 10:33 AM  ?CBC  ?WBC 4.0 - 10.5 K/uL 8.5   10.6   5.6    ?Hemoglobin 13.0 - 17.0 g/dL 9.7   04/15/2021   08.6    ?Hematocrit 39.0 - 52.0 % 32.1   36.6   36.0    ?Platelets 150 - 400 K/uL 178   205   160    ? ? ?  Latest Ref Rng & Units 09/16/2021  ?  4:02 AM 09/15/2021  ?  4:16 AM 09/14/2021  ?  5:54 PM  ?CMP  ?Glucose 70 - 99 mg/dL 09/16/2021   950   932    ?BUN 6 - 20 mg/dL 24   22   19     ?Creatinine 0.61 - 1.24 mg/dL 671     2.45    ?Sodium 135 - 145 mmol/L 140   140   138    ?Potassium 3.5 - 5.1 mmol/L 3.9   4.0   4.0    ?Chloride 98 - 111 mmol/L 108   106   102    ?CO2 22 - 32 mmol/L 27   28   28     ?Calcium 8.9 - 10.3 mg/dL 8.3   8.4   9.0    ?Total Protein 6.5 - 8.1 g/dL   7.8    ?Total Bilirubin 0.3 - 1.2 mg/dL   1.1    ?Alkaline Phos 38 - 126 U/L   63    ?AST 15 - 41 U/L   15    ?ALT 0 - 44 U/L   15    ? ? ?Imaging studies: No new pertinent imaging  studies ? ? ?Assessment/Plan:  ?56 y.o. male with abdominal pain, nausea and vomiting with comorbidities such as uncontrolled diabetes mellitus type 2. ?  ?Abdominal pain, nausea and vomiting: ?-Abdominal x-ray yesterday with Gastrografin shows the whole contrast travels to the large intestine in less than 8 hours.   ?-I agree with radiologist with his impression: "Oral contrast has reached the colon without any residual contrast in the small bowel loops. This finding makes possibility of significant ?small bowel obstruction unlikely. There is dilation of small-bowel ?loops in the left mid abdomen. There is thickening of mucosal folds ?in the dilated small bowel loops. Findings suggest possible ileus ?due to enteritis." ? ?-Patient tolerated liquid diet.  I advance diet today.  If he tolerated breakfast without any abdominal pain nausea or vomiting, he will be able to be discharged home.  No surgical follow-up needed. ? ?Wilmon Arms, MD ? ? ? ?

## 2022-04-02 ENCOUNTER — Ambulatory Visit: Payer: Self-pay | Admitting: Orthopedic Surgery

## 2022-04-02 ENCOUNTER — Ambulatory Visit: Payer: Self-pay | Admitting: Internal Medicine

## 2022-04-02 ENCOUNTER — Encounter: Payer: Self-pay | Admitting: Orthopedic Surgery

## 2022-04-02 VITALS — BP 131/63 | HR 70 | Temp 98.4°F | Ht 70.0 in | Wt 192.0 lb

## 2022-04-02 DIAGNOSIS — M1710 Unilateral primary osteoarthritis, unspecified knee: Secondary | ICD-10-CM

## 2022-04-02 DIAGNOSIS — H6122 Impacted cerumen, left ear: Secondary | ICD-10-CM

## 2022-04-02 NOTE — Progress Notes (Signed)
   Internal MEDICINE  Office Visit Note  Patient Name: Timothy Hancock  [id number]  [id number]  Date of Service: 04/02/2022  No chief complaint on file.   HPI  Pt is here for routine follow up.    Current Medication: Outpatient Encounter Medications as of 04/02/2022  Medication Sig   blood glucose meter kit and supplies Dispense based on patient and insurance preference. Use up to four times daily as directed. (FOR ICD-10 E10.9, E11.9).   glimepiride (AMARYL) 2 MG tablet Take 1 tablet (2 mg total) by mouth 2 (two) times daily at 10 AM and 5 PM.   glucose blood (FREESTYLE TEST STRIPS) test strip Use as instructed   sitaGLIPtin-metformin (JANUMET) 50-1000 MG tablet Timothy Hancock   No facility-administered encounter medications on file as of 04/02/2022.    Surgical History: Past Surgical History:  Procedure Laterality Date   APPENDECTOMY     CHOLECYSTECTOMY      Medical History: Past Medical History:  Diagnosis Date   Diabetes ([place])    Partial small bowel obstruction ([place]) 09/14/2021    Family History: Family History  Problem Relation Age of Onset   Diabetes Mellitus II Mother    Diabetes Mellitus II Father     Social History   Socioeconomic History   Marital status: Married    Spouse name: Not on file   Number of children: Not on file   Years of education: Not on file   Highest education level: Not on file  Occupational History   Not on file  Tobacco Use   Smoking status: Never   Smokeless tobacco: Never  Substance and Sexual Activity   Alcohol use: No   Drug use: Never   Sexual activity: Not on file  Other Topics Concern   Not on file  Social History Narrative   Not on file   Social Determinants of Health   Financial Resource Strain: Not on file  Food Insecurity: Not on file  Transportation Needs: Not on file  Physical Activity: Not on file  Stress: Not on file  Social Connections: Not on file  Intimate Partner Violence: Not on file       Review of Systems  Vital Signs: There were no vitals taken for this visit.   Physical Exam     Assessment/Plan: EAR PAIN- WAX- LEFT  NEED DROP AND [place] DM- 135-180 [name] EXTRA DOSE-2  TIMES 50/1000 MG FARXIGA- 10 MG- TAKE 1/2 TAB EVERY OTHER DAY    General Counseling: Timothy Hancock verbalizes understanding of the findings of todays visit and agrees with plan of treatment. I have discussed any further diagnostic evaluation that may be needed or ordered today. We also reviewed his medications today. he has been encouraged to call the office with any questions or concerns that should arise related to todays visit.    No orders of the defined types were placed in this encounter.   No orders of the defined types were placed in this encounter.       Wenda Low

## 2022-04-02 NOTE — Progress Notes (Signed)
S:R knee pain, calf pain O: +ve PF tenderness A: R knee DJD P: Diclofanac sodiym   Xrays standing 3 views R knee

## 2022-04-30 ENCOUNTER — Encounter: Payer: Self-pay | Admitting: Family Medicine

## 2022-04-30 ENCOUNTER — Ambulatory Visit: Payer: Self-pay | Admitting: Family Medicine

## 2022-04-30 DIAGNOSIS — H6502 Acute serous otitis media, left ear: Secondary | ICD-10-CM

## 2022-04-30 MED ORDER — CEFDINIR 300 MG PO CAPS
300.0000 mg | ORAL_CAPSULE | Freq: Two times a day (BID) | ORAL | 0 refills | Status: DC
  Filled 2022-04-30: qty 20, 10d supply, fill #0

## 2022-04-30 NOTE — Progress Notes (Signed)
S. Left ear pain for few days after ear cleaning  On exam He is not in acute distress Left ear : TM injected and retracted Heart sounds normal Chest CTA  A. Left OM P. Omnicef 300 mg bid for 10 days  F/u 2 weeks

## 2022-05-01 ENCOUNTER — Other Ambulatory Visit: Payer: Self-pay

## 2022-05-02 ENCOUNTER — Other Ambulatory Visit: Payer: Self-pay

## 2022-05-14 ENCOUNTER — Encounter: Payer: Self-pay | Admitting: Family Medicine

## 2022-05-14 ENCOUNTER — Ambulatory Visit: Payer: Self-pay | Admitting: Adult Health

## 2022-05-16 ENCOUNTER — Other Ambulatory Visit: Payer: Self-pay | Admitting: Cardiovascular Disease

## 2022-05-17 LAB — KIDNEY PROFILE
Creatinine, Ser: 1.01 mg/dL (ref 0.76–1.27)
Creatinine, Urine: 73.3 mg/dL
Microalb/Creat Ratio: 14 mg/g creat (ref 0–29)
Microalbumin, Urine: 10.6 ug/mL
eGFR: 86 mL/min/{1.73_m2} (ref >59)

## 2022-05-17 LAB — CBC WITH DIFFERENTIAL/PLATELET
Basophils Absolute: 0 10*3/uL (ref 0.0–0.2)
Basos: 1 %
EOS (ABSOLUTE): 0.1 10*3/uL (ref 0.0–0.4)
Eos: 2 %
Hematocrit: 36.2 % — ABNORMAL LOW (ref 37.5–51.0)
Hemoglobin: 10.3 g/dL — ABNORMAL LOW (ref 13.0–17.7)
Immature Grans (Abs): 0 10*3/uL (ref 0.0–0.1)
Immature Granulocytes: 0 %
Lymphocytes Absolute: 1.5 10*3/uL (ref 0.7–3.1)
Lymphs: 30 %
MCH: 21.5 pg — ABNORMAL LOW (ref 26.6–33.0)
MCHC: 28.5 g/dL — ABNORMAL LOW (ref 31.5–35.7)
MCV: 76 fL — ABNORMAL LOW (ref 79–97)
Monocytes Absolute: 0.4 10*3/uL (ref 0.1–0.9)
Monocytes: 8 %
Neutrophils Absolute: 3.1 10*3/uL (ref 1.4–7.0)
Neutrophils: 59 %
Platelets: 210 10*3/uL (ref 150–450)
RBC: 4.79 x10E6/uL (ref 4.14–5.80)
RDW: 15.5 % — ABNORMAL HIGH (ref 11.6–15.4)
WBC: 5.1 10*3/uL (ref 3.4–10.8)

## 2022-05-17 LAB — COMPREHENSIVE METABOLIC PANEL
ALT: 17 IU/L (ref 0–44)
AST: 11 IU/L (ref 0–40)
Albumin/Globulin Ratio: 1.6 (ref 1.2–2.2)
Albumin: 4.4 g/dL (ref 3.8–4.9)
Alkaline Phosphatase: 101 IU/L (ref 44–121)
BUN/Creatinine Ratio: 11 (ref 9–20)
BUN: 11 mg/dL (ref 6–24)
Bilirubin Total: 0.4 mg/dL (ref 0.0–1.2)
CO2: 24 mmol/L (ref 20–29)
Calcium: 9.6 mg/dL (ref 8.7–10.2)
Chloride: 102 mmol/L (ref 96–106)
Globulin, Total: 2.8 g/dL (ref 1.5–4.5)
Glucose: 151 mg/dL — ABNORMAL HIGH (ref 70–99)
Potassium: 4.5 mmol/L (ref 3.5–5.2)
Sodium: 136 mmol/L (ref 134–144)
Total Protein: 7.2 g/dL (ref 6.0–8.5)

## 2022-05-17 LAB — LIPID PANEL W/O CHOL/HDL RATIO
Cholesterol, Total: 131 mg/dL (ref 100–199)
HDL: 25 mg/dL — ABNORMAL LOW (ref >39)
LDL Chol Calc (NIH): 82 mg/dL (ref 0–99)
Triglycerides: 130 mg/dL (ref 0–149)
VLDL Cholesterol Cal: 24 mg/dL (ref 5–40)

## 2022-05-17 LAB — PSA: Prostate Specific Ag, Serum: 2.9 ng/mL (ref 0.0–4.0)

## 2022-05-17 LAB — IRON AND TIBC
Iron Saturation: 7 % — CL (ref 15–55)
Iron: 28 ug/dL — ABNORMAL LOW (ref 38–169)
Total Iron Binding Capacity: 374 ug/dL (ref 250–450)
UIBC: 346 ug/dL — ABNORMAL HIGH (ref 111–343)

## 2022-05-17 LAB — HGB A1C W/O EAG: Hgb A1c MFr Bld: 8.1 % — ABNORMAL HIGH (ref 4.8–5.6)

## 2022-05-20 ENCOUNTER — Other Ambulatory Visit: Payer: Self-pay

## 2022-05-28 ENCOUNTER — Ambulatory Visit: Payer: Self-pay | Admitting: Cardiovascular Disease

## 2022-05-28 ENCOUNTER — Encounter: Payer: Self-pay | Admitting: Cardiovascular Disease

## 2022-05-28 VITALS — BP 141/61 | HR 65 | Temp 97.5°F | Ht 70.08 in | Wt 197.0 lb

## 2022-05-28 DIAGNOSIS — D509 Iron deficiency anemia, unspecified: Secondary | ICD-10-CM

## 2022-05-28 NOTE — Progress Notes (Signed)
   Established Patient Office Visit  Subjective   Patient ID: Timothy Hancock, male    DOB: Jul 14, 1965  Age: 56 y.o. MRN: [id number]  Chief Complaint  Patient presents with   Follow-up    Lab follow-up    Otitis Media    Follow-up    HPI  56 year old male who is here today for follow-up visit.  He has known history of type 2 diabetes which has been more controlled recently than last year. He was seen recently for left ear infection after cerumen impaction.  He reports continued pain even after he took antibiotics. He ROS    Objective:     BP (!) 141/61 (BP Location: Left Arm, Patient Position: Sitting, Cuff Size: Normal)   Pulse 65   Temp (!) 97.5 F (36.4 C) (Oral)   Ht 5' 10.08" (1.78 m)   Wt 197 lb (89.4 kg)   SpO2 98%   BMI 28.20 kg/m    Physical Exam  Left ear exam, small amount of wax with intact eardrum.  Heart is regular with no murmurs.    No results found for any visits on 05/28/22.    The 10-year ASCVD risk score (Arnett DK, et al., 2019) is: 17.8%    Assessment & Plan:   1.  Iron deficiency anemia: Reviewed recent labs with him which shows persistent iron deficiency anemia.  He reports having EGD and colonoscopy more than 12 years ago which were unremarkable.  Due to this and his age, recommend GI evaluation for repeat endoscopic procedures.  I asked him to start taking over-the-counter iron supplement twice a day.  2.  Left ear pain: Recently treated with antibiotics but still having symptoms.  Ear exam is unremarkable today but if symptoms persist, recommend referral to ENT for evaluation.  3.  Type 2 diabetes: Diabetes control improved with most recent hemoglobin A1c of 8.1 Problem List Items Addressed This Visit   None  Fu in 3 months  Lorine Bears, MD

## 2022-06-08 ENCOUNTER — Telehealth: Payer: Self-pay | Admitting: *Deleted

## 2022-06-08 NOTE — Telephone Encounter (Signed)
This patient was transferred to my phone. After reviewing his chart and looking at his referral. Even though, patient mention colonoscopy but the referral is for iron deficiency anemia which means patient will need a new patient appointment.  I have explain to patient that more likely he will need a colonoscopy because of his iron deficiency but still need discuss with provider.  Please call patient back.

## 2022-07-14 ENCOUNTER — Ambulatory Visit (INDEPENDENT_AMBULATORY_CARE_PROVIDER_SITE_OTHER): Payer: Self-pay | Admitting: Gastroenterology

## 2022-07-14 ENCOUNTER — Other Ambulatory Visit: Payer: Self-pay

## 2022-07-14 ENCOUNTER — Encounter: Payer: Self-pay | Admitting: Gastroenterology

## 2022-07-14 ENCOUNTER — Telehealth: Payer: Self-pay | Admitting: Gastroenterology

## 2022-07-14 ENCOUNTER — Other Ambulatory Visit
Admission: RE | Admit: 2022-07-14 | Discharge: 2022-07-14 | Disposition: A | Discharge status: Home / Self Care | Payer: Self-pay | Attending: Gastroenterology | Admitting: Gastroenterology

## 2022-07-14 VITALS — BP 150/81 | HR 75 | Temp 97.5°F | Ht 70.0 in | Wt 198.0 lb

## 2022-07-14 DIAGNOSIS — D509 Iron deficiency anemia, unspecified: Secondary | ICD-10-CM

## 2022-07-14 LAB — CBC
HCT: 36.9 % — ABNORMAL LOW (ref 39.0–52.0)
Hemoglobin: 10.8 g/dL — ABNORMAL LOW (ref 13.0–17.0)
MCH: 22.6 pg — ABNORMAL LOW (ref 26.0–34.0)
MCHC: 29.3 g/dL — ABNORMAL LOW (ref 30.0–36.0)
MCV: 77.2 fL — ABNORMAL LOW (ref 80.0–100.0)
Platelets: 174 10*3/uL (ref 150–400)
RBC: 4.78 MIL/uL (ref 4.22–5.81)
RDW: 17.6 % — ABNORMAL HIGH (ref 11.5–15.5)
WBC: 5.9 10*3/uL (ref 4.0–10.5)
nRBC: 0 % (ref 0.0–0.2)

## 2022-07-14 LAB — IRON AND TIBC
Iron: 26 ug/dL — ABNORMAL LOW (ref 45–182)
Saturation Ratios: 6 % — ABNORMAL LOW (ref 17.9–39.5)
TIBC: 469 ug/dL — ABNORMAL HIGH (ref 250–450)
UIBC: 443 ug/dL

## 2022-07-14 LAB — FERRITIN: Ferritin: 4 ng/mL — ABNORMAL LOW (ref 24–336)

## 2022-07-14 LAB — FOLATE: Folate: 24 ng/mL (ref >5.9)

## 2022-07-14 LAB — VITAMIN B12: Vitamin B-12: 1342 pg/mL — ABNORMAL HIGH (ref 180–914)

## 2022-07-14 MED ORDER — GOLYTELY 236 G PO SOLR: 4000.0000 mL | Freq: Once | ORAL | 0 refills | Status: AC

## 2022-07-14 MED ORDER — NA SULFATE-K SULFATE-MG SULF 17.5-3.13-1.6 GM/177ML PO SOLN: 354.0000 mL | Freq: Once | ORAL | 0 refills | Status: AC

## 2022-07-14 NOTE — Addendum Note (Signed)
Addended by: Ulyess Blossom L on: 07/14/2022 04:26 PM   Modules accepted: Orders

## 2022-07-14 NOTE — Telephone Encounter (Signed)
Sent alternative prep to the pharmacy and informed patient

## 2022-07-14 NOTE — Progress Notes (Signed)
Cephas Darby, MD [address]  [place]  [place], [place] [address]  Main: [phone]  Fax: [phone]    Gastroenterology Consultation  Referring Provider:     Wellington [place], MD Primary Care Physician:  Patient, No Pcp Per Primary Gastroenterologist:  Dr. Cephas Darby Reason for Consultation: Iron deficiency anemia        HPI:   Timothy Hancock is a 57 y.o. male referred by Dr. Fletcher Anon for consultation & management of chronic iron deficiency anemia.  Patient is found to have chronic severe iron deficiency anemia.  Based on the labs from 2020, he was found to have a severe iron deficiency, serum ferritin 14, hemoglobin 11.6, normal T73 and folic acid.  He does have history of diabetes, hemoglobin A1c 8.1 as of November 2023.  [facility].  His most recent hemoglobin was 10.3 in November 2023, MCV 76.  Lowest hemoglobin was 9.7 in 08/2021.  He is s/p appendectomy and cholecystectomy.  NSAIDs: None  Antiplts/Anticoagulants/Anti thrombotics: None  GI Procedures: Reports undergoing upper endoscopy and colonoscopy in 2013 by Dr. Dionne Milo for iron deficiency anemia, reportedly normal  Past Medical History:  Diagnosis Date   Diabetes ([place])    Partial small bowel obstruction ([place]) 09/14/2021    Past Surgical History:  Procedure Laterality Date   APPENDECTOMY     CHOLECYSTECTOMY       Current Outpatient Medications:    blood glucose meter kit and supplies, Dispense based on patient and insurance preference. Use up to four times daily as directed. (FOR ICD-10 E10.9, E11.9)., Disp: 1 each, Rfl: 3   glimepiride (AMARYL) 2 MG tablet, Take 1 tablet (2 mg total) by mouth 2 (two) times daily at 10 AM and 5 PM., Disp: 180 tablet, Rfl: 3   glucose blood (FREESTYLE TEST STRIPS) test strip, Use as instructed, Disp: 100 each, Rfl: 6   Na Sulfate-K Sulfate-Mg Sulf 17.5-3.13-1.6 GM/177ML SOLN, Take 354 mLs by mouth once for 1 dose., Disp:  354 mL, Rfl: 0   sitaGLIPtin-metformin (JANUMET) 50-1000 MG tablet, Timothy Hancock, Disp: 180 tablet, Rfl: 3   Family History  Problem Relation Age of Onset   Diabetes Mellitus II Mother    Diabetes Mellitus II Father      Social History   Tobacco Use   Smoking status: Never   Smokeless tobacco: Never  Substance Use Topics   Alcohol use: No   Drug use: Never    Allergies as of 07/14/2022 - Review Complete 07/14/2022  Allergen Reaction Noted   Pork-derived products  09/14/2021    Review of Systems:    All systems reviewed and negative except where noted in HPI.   Physical Exam:  BP (!) 150/81 (BP Location: Left Arm, Patient Position: Sitting, Cuff Size: Normal)   Pulse 75   Temp (!) 97.5 F (36.4 C) (Oral)   Ht 5\' 10"  (1.778 m)   Wt 198 lb (89.8 kg)   BMI 28.41 kg/m  No LMP for male patient.  General:   Alert,  Well-developed, well-nourished, pleasant and cooperative in NAD Head:  Normocephalic and atraumatic. Eyes:  Sclera clear, no icterus.   Conjunctiva pink. Ears:  Normal auditory acuity. Nose:  No deformity, discharge, or lesions. Mouth:  No deformity or lesions,oropharynx pink & moist. Neck:  Supple; no masses or thyromegaly. Lungs:  Respirations even and unlabored.  Clear throughout to auscultation.   No wheezes, crackles, or rhonchi. No acute  distress. Heart:  Regular rate and rhythm; no murmurs, clicks, rubs, or gallops. Abdomen:  Normal bowel sounds. Soft, non-tender and non-distended without masses, hepatosplenomegaly or hernias noted.  No guarding or rebound tenderness.   Rectal: Not performed Msk:  Symmetrical without gross deformities. Good, equal movement & strength bilaterally. Pulses:  Normal pulses noted. Extremities:  No clubbing or edema.  No cyanosis. Neurologic:  Alert and oriented x3;  grossly normal neurologically. Skin:  Intact without significant lesions or rashes. No jaundice. Psych:  Alert and cooperative. Normal mood and  affect.  Imaging Studies: Reviewed  Assessment and Plan:   [name] is a 57 y.o. male with history of diabetes, chronic iron deficiency anemia with no alarm signs or symptoms  Chronic iron deficiency anemia of unclear etiology CT abdomen pelvis with contrast on 09/13/2021 did not reveal any intra-abdominal pathology Recommend EGD and colonoscopy for further evaluation with possible video capsule endoscopy Continue oral iron every other day Recheck CBC, iron studies, B12 and folate levels   Follow up in 4 months   Cephas Darby, MD

## 2022-07-14 NOTE — Telephone Encounter (Signed)
Pharmacy called about prep and is trying to change the prescription due to cost for patient.

## 2022-07-15 ENCOUNTER — Telehealth: Payer: Self-pay

## 2022-07-15 MED ORDER — FUSION PLUS PO CAPS: 1.0000 | ORAL_CAPSULE | Freq: Every day | ORAL | 1 refills | Status: DC

## 2022-07-15 NOTE — Telephone Encounter (Signed)
Patient verbalized understanding of results. He [facility] he will go pick it up

## 2022-07-15 NOTE — Telephone Encounter (Signed)
-----  Message from Lin Landsman, MD sent at 07/15/2022 11:28 AM EST ----- Please inform patient that he has severe iron deficiency.  He should start taking fusion plus instead of over-the-counter iron.  He does not have insurance, please have him pick up the samples  RV

## 2022-07-18 ENCOUNTER — Encounter: Payer: Self-pay | Admitting: Gastroenterology

## 2022-07-18 NOTE — Anesthesia Preprocedure Evaluation (Addendum)
Anesthesia Evaluation  Patient identified by MRN, date of birth, ID band Patient awake    Reviewed: Allergy & Precautions, NPO status , Patient's Chart, lab work & pertinent test results  History of Anesthesia Complications Negative for: history of anesthetic complications  Airway Mallampati: III   Neck ROM: Full    Dental  (+) Edentulous Upper, Edentulous Lower   Pulmonary neg pulmonary ROS   Pulmonary exam normal breath sounds clear to auscultation       Cardiovascular Exercise Tolerance: Good negative cardio ROS Normal cardiovascular exam Rhythm:Regular Rate:Normal     Neuro/Psych negative neurological ROS     GI/Hepatic ,GERD  Medicated and Controlled,,  Endo/Other  diabetes, Type 2    Renal/GU negative Renal ROS     Musculoskeletal   Abdominal   Peds  Hematology  (+) Blood dyscrasia, anemia   Anesthesia Other Findings   Reproductive/Obstetrics                             Anesthesia Physical Anesthesia Plan  ASA: 2  Anesthesia Plan: General   Post-op Pain Management:    Induction: Intravenous  PONV Risk Score and Plan: 2 and Propofol infusion, TIVA and Treatment may vary due to age or medical condition  Airway Management Planned: Natural Airway  Additional Equipment:   Intra-op Plan:   Post-operative Plan:   Informed Consent: I have reviewed the patients History and Physical, chart, labs and discussed the procedure including the risks, benefits and alternatives for the proposed anesthesia with the patient or authorized representative who has indicated his/her understanding and acceptance.       Plan Discussed with: CRNA  Anesthesia Plan Comments: (LMA/GETA backup discussed.  Patient consented for risks of anesthesia including but not limited to:  - adverse reactions to medications - damage to eyes, teeth, lips or other oral mucosa - nerve damage due to  positioning  - sore throat or hoarseness - damage to heart, brain, nerves, lungs, other parts of body or loss of life  Informed patient about role of CRNA in peri- and intra-operative care.  Patient voiced understanding.)       Anesthesia Quick Evaluation

## 2022-07-19 ENCOUNTER — Ambulatory Visit
Admission: RE | Admit: 2022-07-19 | Discharge: 2022-07-19 | Disposition: A | Discharge status: Home / Self Care | Payer: Self-pay | Attending: Gastroenterology | Admitting: Gastroenterology

## 2022-07-19 ENCOUNTER — Other Ambulatory Visit: Payer: Self-pay

## 2022-07-19 ENCOUNTER — Ambulatory Visit: Payer: Self-pay | Admitting: Anesthesiology

## 2022-07-19 ENCOUNTER — Encounter: Payer: Self-pay | Admitting: Gastroenterology

## 2022-07-19 ENCOUNTER — Encounter
Admission: RE | Disposition: A | Discharge status: Home / Self Care | Payer: Self-pay | Source: Home / Self Care | Attending: Gastroenterology

## 2022-07-19 ENCOUNTER — Telehealth: Payer: Self-pay

## 2022-07-19 DIAGNOSIS — K319 Disease of stomach and duodenum, unspecified: Secondary | ICD-10-CM

## 2022-07-19 DIAGNOSIS — K635 Polyp of colon: Secondary | ICD-10-CM

## 2022-07-19 DIAGNOSIS — K219 Gastro-esophageal reflux disease without esophagitis: Secondary | ICD-10-CM | POA: Insufficient documentation

## 2022-07-19 DIAGNOSIS — Z833 Family history of diabetes mellitus: Secondary | ICD-10-CM | POA: Insufficient documentation

## 2022-07-19 DIAGNOSIS — E1165 Type 2 diabetes mellitus with hyperglycemia: Secondary | ICD-10-CM

## 2022-07-19 DIAGNOSIS — E119 Type 2 diabetes mellitus without complications: Secondary | ICD-10-CM | POA: Insufficient documentation

## 2022-07-19 DIAGNOSIS — Z7984 Long term (current) use of oral hypoglycemic drugs: Secondary | ICD-10-CM | POA: Insufficient documentation

## 2022-07-19 DIAGNOSIS — D509 Iron deficiency anemia, unspecified: Secondary | ICD-10-CM

## 2022-07-19 DIAGNOSIS — K644 Residual hemorrhoidal skin tags: Secondary | ICD-10-CM | POA: Insufficient documentation

## 2022-07-19 DIAGNOSIS — K2960 Other gastritis without bleeding: Secondary | ICD-10-CM | POA: Insufficient documentation

## 2022-07-19 HISTORY — PX: COLONOSCOPY WITH PROPOFOL: SHX5780

## 2022-07-19 HISTORY — DX: Presence of dental prosthetic device (complete) (partial): Z97.2

## 2022-07-19 HISTORY — PX: ESOPHAGOGASTRODUODENOSCOPY (EGD) WITH PROPOFOL: SHX5813

## 2022-07-19 LAB — GLUCOSE, CAPILLARY: Glucose-Capillary: 188 mg/dL — ABNORMAL HIGH (ref 70–99)

## 2022-07-19 SURGERY — COLONOSCOPY WITH PROPOFOL
Anesthesia: General

## 2022-07-19 MED ORDER — STERILE WATER FOR IRRIGATION IR SOLN
Status: DC | PRN
  Administered 2022-07-19: 1000 mL

## 2022-07-19 MED ORDER — ACETAMINOPHEN 325 MG PO TABS: 650.0000 mg | ORAL_TABLET | Freq: Once | ORAL | Status: DC | PRN

## 2022-07-19 MED ORDER — PROPOFOL 10 MG/ML IV BOLUS
INTRAVENOUS | Status: DC | PRN
  Administered 2022-07-19 (×2): 30 mg via INTRAVENOUS
  Administered 2022-07-19: 40 mg via INTRAVENOUS
  Administered 2022-07-19: 30 mg via INTRAVENOUS
  Administered 2022-07-19: 20 mg via INTRAVENOUS
  Administered 2022-07-19: 60 mg via INTRAVENOUS
  Administered 2022-07-19 (×4): 20 mg via INTRAVENOUS

## 2022-07-19 MED ORDER — LACTATED RINGERS IV SOLN: INTRAVENOUS | Status: DC

## 2022-07-19 MED ORDER — GLYCOPYRROLATE 0.2 MG/ML IJ SOLN
INTRAMUSCULAR | Status: DC | PRN
  Administered 2022-07-19: .2 mg via INTRAVENOUS

## 2022-07-19 MED ORDER — ACETAMINOPHEN 160 MG/5ML PO SOLN: 325.0000 mg | ORAL | Status: DC | PRN

## 2022-07-19 MED ORDER — FUSION PLUS PO CAPS: 1.0000 | ORAL_CAPSULE | Freq: Every day | ORAL | 1 refills | Status: AC

## 2022-07-19 MED ORDER — LIDOCAINE HCL (CARDIAC) PF 100 MG/5ML IV SOSY
PREFILLED_SYRINGE | INTRAVENOUS | Status: DC | PRN
  Administered 2022-07-19: 60 mg via INTRAVENOUS

## 2022-07-19 MED ORDER — SODIUM CHLORIDE 0.9 % IV SOLN: INTRAVENOUS | Status: DC

## 2022-07-19 MED ORDER — ONDANSETRON HCL 4 MG/2ML IJ SOLN: 4.0000 mg | Freq: Once | INTRAMUSCULAR | Status: DC | PRN

## 2022-07-19 SURGICAL SUPPLY — 36 items
BALLN DILATOR 12-15 8 (BALLOONS)
BALLN DILATOR 15-18 8 (BALLOONS)
BALLN DILATOR CRE 0-12 8 (BALLOONS)
BALLN DILATOR ESOPH 8 10 CRE (MISCELLANEOUS) IMPLANT
BALLOON DILATOR 12-15 8 (BALLOONS) IMPLANT
BALLOON DILATOR 15-18 8 (BALLOONS) IMPLANT
BALLOON DILATOR CRE 0-12 8 (BALLOONS) IMPLANT
BLOCK BITE 60FR ADLT L/F GRN (MISCELLANEOUS) ×1 IMPLANT
CLIP HMST 235XBRD CATH ROT (MISCELLANEOUS) IMPLANT
CLIP RESOLUTION 360 11X235 (MISCELLANEOUS)
ELECT REM PT RETURN 9FT ADLT (ELECTROSURGICAL)
ELECTRODE REM PT RTRN 9FT ADLT (ELECTROSURGICAL) IMPLANT
FCP ESCP3.2XJMB 240X2.8X (MISCELLANEOUS) ×1
FORCEPS BIOP RAD 4 LRG CAP 4 (CUTTING FORCEPS) IMPLANT
FORCEPS BIOP RJ4 240 W/NDL (MISCELLANEOUS) ×1
FORCEPS ESCP3.2XJMB 240X2.8X (MISCELLANEOUS) IMPLANT
GOWN CVR UNV OPN BCK APRN NK (MISCELLANEOUS) ×2 IMPLANT
GOWN ISOL THUMB LOOP REG UNIV (MISCELLANEOUS) ×2
INJECTOR VARIJECT VIN23 (MISCELLANEOUS) IMPLANT
KIT DEFENDO VALVE AND CONN (KITS) IMPLANT
KIT PRC NS LF DISP ENDO (KITS) ×1 IMPLANT
KIT PROCEDURE OLYMPUS (KITS) ×1
MANIFOLD NEPTUNE II (INSTRUMENTS) ×1 IMPLANT
MARKER SPOT ENDO TATTOO 5ML (MISCELLANEOUS) IMPLANT
PROBE APC STR FIRE (PROBE) IMPLANT
RETRIEVER NET PLAT FOOD (MISCELLANEOUS) IMPLANT
RETRIEVER NET ROTH 2.5X230 LF (MISCELLANEOUS) IMPLANT
SNARE COLD EXACTO (MISCELLANEOUS) IMPLANT
SNARE SHORT THROW 13M SML OVAL (MISCELLANEOUS) IMPLANT
SNARE SHORT THROW 30M LRG OVAL (MISCELLANEOUS) IMPLANT
SNARE SNG USE RND 15MM (INSTRUMENTS) IMPLANT
SYR INFLATION 60ML (SYRINGE) IMPLANT
TRAP ETRAP POLY (MISCELLANEOUS) IMPLANT
VARIJECT INJECTOR VIN23 (MISCELLANEOUS) ×1
WATER STERILE IRR 250ML POUR (IV SOLUTION) ×1 IMPLANT
WIRE CRE 18-20MM 8CM F G (MISCELLANEOUS) IMPLANT

## 2022-07-19 NOTE — Transfer of Care (Signed)
Immediate Anesthesia Transfer of Care Note  Patient: Timothy Hancock  Procedure(s) Performed: COLONOSCOPY WITH PROPOFOL/polypectomy ESOPHAGOGASTRODUODENOSCOPY (EGD) WITH PROPOFOL  Patient Location: PACU  Anesthesia Type: General  Level of Consciousness: awake, alert  and patient cooperative  Airway and Oxygen Therapy: Patient Spontanous Breathing and Patient connected to supplemental oxygen  Post-op Assessment: Post-op Vital signs reviewed, Patient's Cardiovascular Status Stable, Respiratory Function Stable, Patent Airway and No signs of Nausea or vomiting  Post-op Vital Signs: Reviewed and stable  Complications: No notable events documented.

## 2022-07-19 NOTE — H&P (Signed)
Cephas Darby, MD [address]  [place]  [place], [place] [address]  Main: [phone]  Fax: [phone] Pager: [phone]  Primary Care Physician:  Patient, No Pcp Per Primary Gastroenterologist:  Dr. Cephas Darby  Pre-Procedure History & Physical: HPI:  Timothy Hancock is a 57 y.o. male is here for an endoscopy and colonoscopy.   Past Medical History:  Diagnosis Date   Diabetes ([place])    Partial small bowel obstruction ([place]) 09/14/2021   Wears dentures    full upper and lower    Past Surgical History:  Procedure Laterality Date   APPENDECTOMY     CHOLECYSTECTOMY      Prior to Admission medications   Medication Sig Start Date End Date Taking? Authorizing Provider  glimepiride (AMARYL) 2 MG tablet Take 1 tablet (2 mg total) by mouth 2 (two) times daily at 10 AM and 5 PM. 07/11/20  Yes Wenda Low, MD  sitaGLIPtin-metformin (JANUMET) 50-1000 MG tablet Viann Shove masoud 08/31/19 07/18/22 Yes Cletis Athens, MD  blood glucose meter kit and supplies Dispense based on patient and insurance preference. Use up to four times daily as directed. (FOR ICD-10 E10.9, E11.9). 08/31/19   Cletis Athens, MD  glucose blood (FREESTYLE TEST STRIPS) test strip Use as instructed 08/31/19 07/14/22  Cletis Athens, MD  Iron-FA-B Cmp-C-Biot-Probiotic (FUSION PLUS) CAPS Take 1 capsule by mouth daily. 07/15/22   Lin Landsman, MD    Allergies as of 07/14/2022 - Review Complete 07/14/2022  Allergen Reaction Noted   Pork-derived products  09/14/2021    Family History  Problem Relation Age of Onset   Diabetes Mellitus II Mother    Diabetes Mellitus II Father     Social History   Socioeconomic History   Marital status: Married    Spouse name: Not on file   Number of children: Not on file   Years of education: Not on file   Highest education level: Not on file  Occupational History   Not on file  Tobacco Use   Smoking status: Never   Smokeless tobacco: Never  Vaping Use    Vaping Use: Never used  Substance and Sexual Activity   Alcohol use: No   Drug use: Never   Sexual activity: Not on file  Other Topics Concern   Not on file  Social History Narrative   Not on file   Social Determinants of Health   Financial Resource Strain: Not on file  Food Insecurity: Not on file  Transportation Needs: Not on file  Physical Activity: Not on file  Stress: Not on file  Social Connections: Not on file  Intimate Partner Violence: Not on file    Review of Systems: See HPI, otherwise negative ROS  Physical Exam: BP (!) 119/95   Pulse 83   Temp 97.8 F (36.6 C) (Temporal)   Ht 5\' 10"  (1.778 m)   Wt 87.5 kg   SpO2 100%   BMI 27.69 kg/m  General:   Alert,  pleasant and cooperative in NAD Head:  Normocephalic and atraumatic. Neck:  Supple; no masses or thyromegaly. Lungs:  Clear throughout to auscultation.    Heart:  Regular rate and rhythm. Abdomen:  Soft, nontender and nondistended. Normal bowel sounds, without guarding, and without rebound.   Neurologic:  Alert and  oriented x4;  grossly normal neurologically.  Impression/Plan: Timothy Hancock is here for an endoscopy and colonoscopy to be performed for chronic IDA  Risks, benefits, limitations, and alternatives regarding  endoscopy and colonoscopy  have been reviewed with the patient.  Questions have been answered.  All parties agreeable.   Sherri Sear, MD  07/19/2022, 10:44 AM

## 2022-07-19 NOTE — Op Note (Signed)
[facility] Gastroenterology Patient Name: Timothy Hancock Procedure Date: 07/19/2022 10:44 AM MRN: [id number] Account #: [id number] Date of Birth: 26-Feb-1966 Admit Type: Outpatient Age: 57 Room: [facility] OR ROOM 01 Gender: Male Note Status: Finalized Instrument Name: [id number] Procedure:             Upper GI endoscopy Indications:           Unexplained iron deficiency anemia Providers:             Toney Reil MD, MD Medicines:             General Anesthesia Complications:         No immediate complications. Estimated blood loss: None. Procedure:             Pre-Anesthesia Assessment:                        - Prior to the procedure, a History and Physical was                         performed, and patient medications and allergies were                         reviewed. The patient is competent. The risks and                         benefits of the procedure and the sedation options and                         risks were discussed with the patient. All questions                         were answered and informed consent was obtained.                         Patient identification and proposed procedure were                         verified by the physician, the nurse, the                         anesthesiologist, the anesthetist and the technician                         in the pre-procedure area in the procedure room in the                         endoscopy suite. Mental Status Examination: alert and                         oriented. Airway Examination: normal oropharyngeal                         airway and neck mobility. Respiratory Examination:                         clear to auscultation. CV Examination: normal.                         Prophylactic Antibiotics:  The patient does not require                         prophylactic antibiotics. Prior Anticoagulants: The                         patient has taken no anticoagulant or antiplatelet                          agents. ASA Grade Assessment: II - A patient with mild                         systemic disease. After reviewing the risks and                         benefits, the patient was deemed in satisfactory                         condition to undergo the procedure. The anesthesia                         plan was to use general anesthesia. Immediately prior                         to administration of medications, the patient was                         re-assessed for adequacy to receive sedatives. The                         heart rate, respiratory rate, oxygen saturations,                         blood pressure, adequacy of pulmonary ventilation, and                         response to care were monitored throughout the                         procedure. The physical status of the patient was                         re-assessed after the procedure.                        After obtaining informed consent, the endoscope was                         passed under direct vision. Throughout the procedure,                         the patient's blood pressure, pulse, and oxygen                         saturations were monitored continuously. The Endoscope                         was introduced through the mouth, and advanced to the  second part of duodenum. The upper GI endoscopy was                         accomplished without difficulty. The patient tolerated                         the procedure well. Findings:      The duodenal bulb and second portion of the duodenum were normal.       Biopsies for histology were taken with a cold forceps for evaluation of       celiac disease.      The entire examined stomach was normal. Biopsies were taken with a cold       forceps for Helicobacter pylori testing.      The cardia and gastric fundus were normal on retroflexion.      Esophagogastric landmarks were identified: the gastroesophageal junction       was found at 38 cm from  the incisors.      The gastroesophageal junction and examined esophagus were normal. Impression:            - Normal duodenal bulb and second portion of the                         duodenum. Biopsied.                        - Normal stomach. Biopsied.                        - Esophagogastric landmarks identified.                        - Normal gastroesophageal junction and esophagus. Recommendation:        - Await pathology results.                        - Proceed with colonoscopy as scheduled                        See colonoscopy report Procedure Code(s):     --- Professional ---                        [phone], Esophagogastroduodenoscopy, flexible,                         transoral; with biopsy, single or multiple Diagnosis Code(s):     --- Professional ---                        D50.9, Iron deficiency anemia, unspecified CPT copyright 2022 American Medical Association. All rights reserved. The codes documented in this report are preliminary and upon coder review may  be revised to meet current compliance requirements. Dr. Ulyess Mort Lin Landsman MD, MD 07/19/2022 11:00:18 AM This report has been signed electronically. Number of Addenda: 0 Note Initiated On: 07/19/2022 10:44 AM Total Procedure Duration: 0 hours 5 minutes 21 seconds  Estimated Blood Loss:  Estimated blood loss: none.      [facility]

## 2022-07-19 NOTE — Telephone Encounter (Signed)
Per Dr. Marius Ditch Patient or his wife may come to [place] to pick up fusion samples and send another prescription to a retail pharmacy in [place].  Sent other script to the pharmacy

## 2022-07-19 NOTE — Op Note (Signed)
[facility] [place] Gastroenterology Patient Name: Timothy Hancock Procedure Date: 07/19/2022 10:41 AM MRN: [id number] Account #: [id number] Date of Birth: [birth year] Admit Type: Outpatient Age: 57 Room: [facility] OR ROOM 01 Gender: Male Note Status: Finalized Instrument Name: [id number] Procedure:             Colonoscopy Indications:           Unexplained iron deficiency anemia Providers:             Toney Reil MD, MD Medicines:             General Anesthesia Complications:         No immediate complications. Estimated blood loss: None. Procedure:             Pre-Anesthesia Assessment:                        - Prior to the procedure, a History and Physical was                         performed, and patient medications and allergies were                         reviewed. The patient is competent. The risks and                         benefits of the procedure and the sedation options and                         risks were discussed with the patient. All questions                         were answered and informed consent was obtained.                         Patient identification and proposed procedure were                         verified by the physician, the nurse, the                         anesthesiologist, the anesthetist and the technician                         in the pre-procedure area in the procedure room in the                         endoscopy suite. Mental Status Examination: alert and                         oriented. Airway Examination: normal oropharyngeal                         airway and neck mobility. Respiratory Examination:                         clear to auscultation. CV Examination: normal.                         Prophylactic Antibiotics: The patient  does not require                         prophylactic antibiotics. Prior Anticoagulants: The                         patient has taken no anticoagulant or antiplatelet                          agents. ASA Grade Assessment: II - A patient with mild                         systemic disease. After reviewing the risks and                         benefits, the patient was deemed in satisfactory                         condition to undergo the procedure. The anesthesia                         plan was to use general anesthesia. Immediately prior                         to administration of medications, the patient was                         re-assessed for adequacy to receive sedatives. The                         heart rate, respiratory rate, oxygen saturations,                         blood pressure, adequacy of pulmonary ventilation, and                         response to care were monitored throughout the                         procedure. The physical status of the patient was                         re-assessed after the procedure.                        After obtaining informed consent, the colonoscope was                         passed under direct vision. Throughout the procedure,                         the patient's blood pressure, pulse, and oxygen                         saturations were monitored continuously. The was                         introduced through the anus and advanced to the the  terminal ileum, with identification of the appendiceal                         orifice and IC valve. The colonoscopy was performed                         without difficulty. The patient tolerated the                         procedure well. The quality of the bowel preparation                         was evaluated using the BBPS [facility] Bowel Preparation                         Scale) with scores of: Right Colon = 3, Transverse                         Colon = 3 and Left Colon = 3 (entire mucosa seen well                         with no residual staining, small fragments of stool or                         opaque liquid). The total BBPS score equals 9. The                          terminal ileum, ileocecal valve, appendiceal orifice,                         and rectum were photographed. Findings:      The perianal and digital rectal examinations were normal. Pertinent       negatives include normal sphincter tone and no palpable rectal lesions.      A 5 mm polyp was found in the transverse colon. The polyp was sessile.       The polyp was removed with a cold snare. Resection and retrieval were       complete. Estimated blood loss: none.      Non-bleeding external hemorrhoids were found during retroflexion. The       hemorrhoids were medium-sized.      The exam was otherwise without abnormality. Impression:            - One 5 mm polyp in the transverse colon, removed with                         a cold snare. Resected and retrieved.                        - Non-bleeding external hemorrhoids.                        - The examination was otherwise normal. Recommendation:        - Discharge patient to home (with spouse).                        - Resume previous diet today.                        -  Continue present medications.                        - Await pathology results. Procedure Code(s):     --- Professional ---                        [phone], Colonoscopy, flexible; with removal of                         tumor(s), polyp(s), or other lesion(s) by snare                         technique Diagnosis Code(s):     --- Professional ---                        D12.3, Benign neoplasm of transverse colon (hepatic                         flexure or splenic flexure)                        K64.4, Residual hemorrhoidal skin tags                        D50.9, Iron deficiency anemia, unspecified CPT copyright 2022 American Medical Association. All rights reserved. The codes documented in this report are preliminary and upon coder review may  be revised to meet current compliance requirements. Dr. Ulyess Mort Lin Landsman MD, MD 07/19/2022 11:23:53  AM This report has been signed electronically. Number of Addenda: 0 Note Initiated On: 07/19/2022 10:41 AM Scope Withdrawal Time: 0 hours 18 minutes 8 seconds  Total Procedure Duration: 0 hours 20 minutes 16 seconds  Estimated Blood Loss:  Estimated blood loss: none.      [facility]

## 2022-07-19 NOTE — Anesthesia Postprocedure Evaluation (Signed)
Anesthesia Post Note  Patient: Timothy Hancock  Procedure(s) Performed: COLONOSCOPY WITH PROPOFOL/polypectomy ESOPHAGOGASTRODUODENOSCOPY (EGD) WITH PROPOFOL  Patient location during evaluation: PACU Anesthesia Type: General Level of consciousness: awake and alert, oriented and patient cooperative Pain management: pain level controlled Vital Signs Assessment: post-procedure vital signs reviewed and stable Respiratory status: spontaneous breathing, nonlabored ventilation and respiratory function stable Cardiovascular status: blood pressure returned to baseline and stable Postop Assessment: adequate PO intake Anesthetic complications: no   No notable events documented.   Last Vitals:  Vitals:   07/19/22 1128 07/19/22 1130  BP:  116/77  Pulse: 88 86  Resp: 15 16  Temp:    SpO2: 98% 99%    Last Pain:  Vitals:   07/19/22 1130  TempSrc:   PainSc: 0-No pain                 Darrin Nipper

## 2022-07-20 ENCOUNTER — Encounter: Payer: Self-pay | Admitting: Gastroenterology

## 2022-07-21 ENCOUNTER — Encounter: Payer: Self-pay | Admitting: Gastroenterology

## 2022-07-21 ENCOUNTER — Telehealth: Payer: Self-pay

## 2022-07-21 ENCOUNTER — Other Ambulatory Visit: Payer: Self-pay

## 2022-07-21 DIAGNOSIS — D509 Iron deficiency anemia, unspecified: Secondary | ICD-10-CM

## 2022-07-21 LAB — SURGICAL PATHOLOGY

## 2022-07-21 NOTE — Telephone Encounter (Signed)
Patient pick up samples on Tuesday and knows medication is at pharmacy. Patient schedule capsule study for 07/28/2022. Went over instructions and sent to [employer] account.

## 2022-07-21 NOTE — Telephone Encounter (Signed)
-----  Message from Lin Landsman, MD sent at 07/21/2022 11:49 AM EST ----- Please inform patient that the pathology results from upper endoscopy and colonoscopy came back normal.  As discussed earlier, next step would be to proceed with video capsule endoscopy if patient is agreeable  Not sure if he stopped by [facility] office to pick up fusion samples.  Please let him know about the [facility]  Thank you  Rohini Vanga

## 2022-07-28 ENCOUNTER — Encounter
Admission: RE | Disposition: A | Discharge status: Home / Self Care | Payer: Self-pay | Source: Home / Self Care | Attending: Gastroenterology

## 2022-07-28 ENCOUNTER — Ambulatory Visit
Admission: RE | Admit: 2022-07-28 | Discharge: 2022-07-28 | Disposition: A | Discharge status: Home / Self Care | Payer: Self-pay | Attending: Gastroenterology | Admitting: Gastroenterology

## 2022-07-28 DIAGNOSIS — D509 Iron deficiency anemia, unspecified: Secondary | ICD-10-CM | POA: Insufficient documentation

## 2022-07-28 HISTORY — PX: GIVENS CAPSULE STUDY: SHX5432

## 2022-07-28 SURGERY — IMAGING PROCEDURE, GI TRACT, INTRALUMINAL, VIA CAPSULE

## 2022-07-29 ENCOUNTER — Encounter: Payer: Self-pay | Admitting: Gastroenterology

## 2022-08-08 ENCOUNTER — Telehealth: Payer: Self-pay

## 2022-08-08 DIAGNOSIS — D509 Iron deficiency anemia, unspecified: Secondary | ICD-10-CM

## 2022-08-08 NOTE — Telephone Encounter (Signed)
Pt returned call to get results callback [phone]

## 2022-08-08 NOTE — Telephone Encounter (Signed)
Patient verbalized understanding of results. And will come back in 3 months for labs

## 2022-08-08 NOTE — Telephone Encounter (Signed)
-----   Message from Lin Landsman, MD sent at 08/07/2022  3:57 PM EST ----- Regarding: VCE results Study is complete, capsule reached cecum But, it appears that Study was interrupted due to recorder being turned on and off periodically. Some of the image capturing might be lost. Visualized small bowel images were normal  Recs: Continue oral iron Recheck CBC, iron panel in 3 months  RV

## 2022-08-08 NOTE — Telephone Encounter (Signed)
Called and left a message for call back  

## 2022-08-27 ENCOUNTER — Ambulatory Visit: Payer: Self-pay | Admitting: Internal Medicine

## 2022-08-27 ENCOUNTER — Encounter: Payer: Self-pay | Admitting: Internal Medicine

## 2022-08-27 VITALS — BP 128/68 | HR 78 | Temp 97.6°F | Ht 71.0 in | Wt 194.0 lb

## 2022-08-27 DIAGNOSIS — D508 Other iron deficiency anemias: Secondary | ICD-10-CM

## 2022-08-27 DIAGNOSIS — Z794 Long term (current) use of insulin: Secondary | ICD-10-CM

## 2022-08-27 DIAGNOSIS — E1149 Type 2 diabetes mellitus with other diabetic neurological complication: Secondary | ICD-10-CM

## 2022-08-27 NOTE — Progress Notes (Signed)
Internal MEDICINE  Office Visit Note  Patient Name: Timothy Hancock  [id number]  [id number]  Date of Service: 08/27/2022  Chief Complaint  Patient presents with   Follow-up   Diabetes    HPI  Pt is here for routine follow up.  DM-  still sugar high last AIC 8.1- insulin -  glimeperide  and janumet/ ozempic [place] -  GI issue  Reaction to insulin- Anemia- iron Oral  still fatigue-  w/u GI all ok Tresiba insulin  currently on 25 units Ozempic 1 mg weekly.     Current Medication: Outpatient Encounter Medications as of 08/27/2022  Medication Sig   blood glucose meter kit and supplies Dispense based on patient and insurance preference. Use up to four times daily as directed. (FOR ICD-10 E10.9, E11.9).   glimepiride (AMARYL) 2 MG tablet Take 1 tablet (2 mg total) by mouth 2 (two) times daily at 10 AM and 5 PM.   glucose blood (FREESTYLE TEST STRIPS) test strip Use as instructed   Iron-FA-B Cmp-C-Biot-Probiotic (FUSION PLUS) CAPS Take 1 capsule by mouth daily.   sitaGLIPtin-metformin (JANUMET) 50-1000 MG tablet [name]   No facility-administered encounter medications on file as of 08/27/2022.    Surgical History: Past Surgical History:  Procedure Laterality Date   APPENDECTOMY     CHOLECYSTECTOMY     COLONOSCOPY WITH PROPOFOL N/A 07/19/2022   Procedure: COLONOSCOPY WITH PROPOFOL/polypectomy;  Surgeon: Lin Landsman, MD;  Location: [place];  Service: Endoscopy;  Laterality: N/A;   ESOPHAGOGASTRODUODENOSCOPY (EGD) WITH PROPOFOL N/A 07/19/2022   Procedure: ESOPHAGOGASTRODUODENOSCOPY (EGD) WITH PROPOFOL;  Surgeon: Lin Landsman, MD;  Location: [place];  Service: Endoscopy;  Laterality: N/A;  Diabetic   GIVENS CAPSULE STUDY N/A 07/28/2022   Procedure: GIVENS CAPSULE STUDY;  Surgeon: Lin Landsman, MD;  Location: [facility] END[facility]COPY;  Service: Gastroenterology;  Laterality: N/A;    Medical History: Past Medical History:  Diagnosis Date    Diabetes ([place])    Partial small bowel obstruction ([place]) 09/14/2021   Wears dentures    full upper and lower    Family History: Family History  Problem Relation Age of Onset   Diabetes Mellitus II Mother    Diabetes Mellitus II Father     Social History   Socioeconomic History   Marital status: Married    Spouse name: Not on file   Number of children: Not on file   Years of education: Not on file   Highest education level: Not on file  Occupational History   Not on file  Tobacco Use   Smoking status: Never   Smokeless tobacco: Never  Vaping Use   Vaping Use: Never used  Substance and Sexual Activity   Alcohol use: No   Drug use: Never   Sexual activity: Not on file  Other Topics Concern   Not on file  Social History Narrative   Not on file   Social Determinants of Health   Financial Resource Strain: Not on file  Food Insecurity: Not on file  Transportation Needs: Not on file  Physical Activity: Not on file  Stress: Not on file  Social Connections: Not on file  Intimate Partner Violence: Not on file      Review of Systems  Vital Signs: BP 128/68 (BP Location: Left Arm, Patient Position: Sitting, Cuff Size: Normal)   Pulse 78   Temp 97.6 F (36.4 C) (Oral)   Ht '5\' 11"'$  (1.803 m)   Wt 194 lb (88 kg)  SpO2 93%   BMI 27.06 kg/m    Physical Exam     Assessment/Plan: DM- still AIC high- recheck labs Change tresiba to 10 unit in am and 20 unit in PM ( for ramadhan) F/u after lab Anemia- on oral iron-   may consider IV iron- check iron and cbc first.  General Counseling: Jasiah verbalizes understanding of the findings of todays visit and agrees with plan of treatment. I have discussed any further diagnostic evaluation that may be needed or ordered today. We also reviewed his medications today. he has been encouraged to call the office with any questions or concerns that should arise related to todays visit.    No orders of the defined types were  placed in this encounter.   No orders of the defined types were placed in this encounter.       Wenda Low

## 2022-08-30 ENCOUNTER — Other Ambulatory Visit: Payer: Self-pay | Admitting: Internal Medicine

## 2022-08-31 LAB — CBC
Hematocrit: 35.6 % — ABNORMAL LOW (ref 37.5–51.0)
Hemoglobin: 10.6 g/dL — ABNORMAL LOW (ref 13.0–17.7)
MCH: 23.5 pg — ABNORMAL LOW (ref 26.6–33.0)
MCHC: 29.8 g/dL — ABNORMAL LOW (ref 31.5–35.7)
MCV: 79 fL (ref 79–97)
Platelets: 204 10*3/uL (ref 150–450)
RBC: 4.51 x10E6/uL (ref 4.14–5.80)
RDW: 15.8 % — ABNORMAL HIGH (ref 11.6–15.4)
WBC: 5.6 10*3/uL (ref 3.4–10.8)

## 2022-08-31 LAB — COMPREHENSIVE METABOLIC PANEL
ALT: 13 IU/L (ref 0–44)
AST: 15 IU/L (ref 0–40)
Albumin/Globulin Ratio: 1.7 (ref 1.2–2.2)
Albumin: 4.3 g/dL (ref 3.8–4.9)
Alkaline Phosphatase: 91 IU/L (ref 44–121)
BUN/Creatinine Ratio: 14 (ref 9–20)
BUN: 14 mg/dL (ref 6–24)
Bilirubin Total: 0.4 mg/dL (ref 0.0–1.2)
CO2: 23 mmol/L (ref 20–29)
Calcium: 9.2 mg/dL (ref 8.7–10.2)
Chloride: 103 mmol/L (ref 96–106)
Creatinine, Ser: 1 mg/dL (ref 0.76–1.27)
Globulin, Total: 2.5 g/dL (ref 1.5–4.5)
Glucose: 165 mg/dL — ABNORMAL HIGH (ref 70–99)
Potassium: 4.2 mmol/L (ref 3.5–5.2)
Sodium: 139 mmol/L (ref 134–144)
Total Protein: 6.8 g/dL (ref 6.0–8.5)
eGFR: 87 mL/min/{1.73_m2} (ref >59)

## 2022-08-31 LAB — LIPID PANEL W/O CHOL/HDL RATIO
Cholesterol, Total: 119 mg/dL (ref 100–199)
HDL: 23 mg/dL — ABNORMAL LOW (ref >39)
LDL Chol Calc (NIH): 73 mg/dL (ref 0–99)
Triglycerides: 124 mg/dL (ref 0–149)
VLDL Cholesterol Cal: 23 mg/dL (ref 5–40)

## 2022-08-31 LAB — VITAMIN D 25 HYDROXY (VIT D DEFICIENCY, FRACTURES): Vit D, 25-Hydroxy: 15.5 ng/mL — ABNORMAL LOW (ref 30.0–100.0)

## 2022-08-31 LAB — FERRITIN: Ferritin: 9 ng/mL — ABNORMAL LOW (ref 30–400)

## 2022-08-31 LAB — IRON AND TIBC
Iron Saturation: 6 % — CL (ref 15–55)
Iron: 24 ug/dL — ABNORMAL LOW (ref 38–169)
Total Iron Binding Capacity: 398 ug/dL (ref 250–450)
UIBC: 374 ug/dL — ABNORMAL HIGH (ref 111–343)

## 2022-08-31 LAB — HGB A1C W/O EAG: Hgb A1c MFr Bld: 8.9 % — ABNORMAL HIGH (ref 4.8–5.6)

## 2022-09-16 NOTE — Addendum Note (Signed)
Addended by: Tyler Pita on: 09/16/2022 11:18 AM   Modules accepted: Orders

## 2022-09-16 NOTE — Addendum Note (Signed)
Addended by: Tyler Pita on: 09/16/2022 11:13 AM   Modules accepted: Orders

## 2022-09-19 ENCOUNTER — Inpatient Hospital Stay: Payer: Self-pay

## 2022-09-19 ENCOUNTER — Inpatient Hospital Stay: Payer: Self-pay | Admitting: Oncology

## 2022-09-20 ENCOUNTER — Encounter: Payer: Self-pay | Admitting: Oncology

## 2022-09-20 ENCOUNTER — Inpatient Hospital Stay: Payer: Self-pay

## 2022-09-20 ENCOUNTER — Telehealth: Payer: Self-pay | Admitting: *Deleted

## 2022-09-20 ENCOUNTER — Inpatient Hospital Stay: Payer: Self-pay | Attending: Oncology | Admitting: Oncology

## 2022-09-20 VITALS — BP 126/80 | HR 79 | Temp 97.6°F | Resp 18 | Ht 71.0 in | Wt 194.5 lb

## 2022-09-20 DIAGNOSIS — D508 Other iron deficiency anemias: Secondary | ICD-10-CM | POA: Insufficient documentation

## 2022-09-20 NOTE — Telephone Encounter (Signed)
Pt wanted to get an order and print it so he can go and get UA, and h pylori antigen at the free [facility]. Paper was given and he will let us know about it . Then I sent a in basket for Estelle Grumbles to see if she can help for possibly getting free drug of iron for pt. He also knows that she will be reaching out to her to see if he qualafies.

## 2022-09-20 NOTE — Progress Notes (Signed)
Hematology/Oncology Consult note [facility] Telephone:([phone] [phone]  Patient Care Team: Patient, No Pcp Per as PCP - General (General Practice)   Name of the patient: Timothy Hancock  [id number]  18-May-1966    Reason for referral-iron deficiency anemia   Referring physician-Dr. Lysle [name]  Date of visit: 09/20/22   History of presenting illness- Patient is a 57 year old male referred for iron deficiency anemia.  CBC from 08/30/2022 showed white count of 5.6, H&H of 10.6/35.6 with an MCV of 79 and a platelet count of 204.  Ferritin levels were low at 9.  Iron studies showed low iron saturation of 6% and elevated TIBC.  Patient has had chronic iron deficiency with ferritin levels less than 30 for the last 4 years.  He had a colonoscopy in January 2024 by Dr. Jacelyn Grip which showed a 5 mm polyp in the transverse colon and nonbleeding external hemorrhoids upper endoscopy was normal.  Capsule study was completed and some image capturing might be loss due to interrupted recording.  Patient's hemoglobin was 12.7 back in 2017 but since 2021 his hemoglobin has remained around 10.  He denies any blood loss in his stool or urine. Denies any dark melanotic stools.  He has not been out of [place] for many years.He has had an upper endoscopy colonoscopy and capsule endoscopy by Dr. Marius Ditch in January 2024 which did not show any evidence of active bleeding or malignancy.  Biopsies were negative for celiac disease and H. pylori.  ECOG PS- 0  Pain scale- 0   Review of systems- Review of Systems  Constitutional:  Positive for malaise/fatigue. Negative for chills, fever and weight loss.  HENT:  Negative for congestion, ear discharge and nosebleeds.   Eyes:  Negative for blurred vision.  Respiratory:  Negative for cough, hemoptysis, sputum production, shortness of breath and wheezing.   Cardiovascular:  Negative for chest pain, palpitations, orthopnea and claudication.   Gastrointestinal:  Negative for abdominal pain, blood in stool, constipation, diarrhea, heartburn, melena, nausea and vomiting.  Genitourinary:  Negative for dysuria, flank pain, frequency, hematuria and urgency.  Musculoskeletal:  Negative for back pain, joint pain and myalgias.  Skin:  Negative for rash.  Neurological:  Negative for dizziness, tingling, focal weakness, seizures, weakness and headaches.  Endo/Heme/Allergies:  Does not bruise/bleed easily.  Psychiatric/Behavioral:  Negative for depression and suicidal ideas. The patient does not have insomnia.     Allergies  Allergen Reactions   Garlic Nausea And Vomiting   Onion Nausea And Vomiting   Pork-Derived Products Nausea And Vomiting    Patient Active Problem List   Diagnosis Date Noted   Iron deficiency anemia 07/19/2022   Gastric erythema 07/19/2022   Polyp of transverse colon 07/19/2022   History of abdominal surgery 09/15/2021   Uncontrolled type 2 diabetes mellitus with hyperglycemia, without long-term current use of insulin ([place]) 09/14/2021   Chronic anemia 09/14/2021     Past Medical History:  Diagnosis Date   Diabetes ([place])    Partial small bowel obstruction ([place]) 09/14/2021   Wears dentures    full upper and lower     Past Surgical History:  Procedure Laterality Date   APPENDECTOMY     CHOLECYSTECTOMY     COLONOSCOPY WITH PROPOFOL N/A 07/19/2022   Procedure: COLONOSCOPY WITH PROPOFOL/polypectomy;  Surgeon: Lin Landsman, MD;  Location: [place];  Service: Endoscopy;  Laterality: N/A;   ESOPHAGOGASTRODUODENOSCOPY (EGD) WITH PROPOFOL N/A 07/19/2022   Procedure: ESOPHAGOGASTRODUODENOSCOPY (EGD) WITH PROPOFOL;  Surgeon: Lin Landsman, MD;  Location: [place];  Service: Endoscopy;  Laterality: N/A;  Diabetic   GIVENS CAPSULE STUDY N/A 07/28/2022   Procedure: GIVENS CAPSULE STUDY;  Surgeon: Lin Landsman, MD;  Location[facility];  Service: Gastroenterology;   Laterality: N/A;    Social History   Socioeconomic History   Marital status: Married    Spouse name: Not on file   Number of children: Not on file   Years of education: Not on file   Highest education level: Not on file  Occupational History   Not on file  Tobacco Use   Smoking status: Never   Smokeless tobacco: Never  Vaping Use   Vaping Use: Never used  Substance and Sexual Activity   Alcohol use: No   Drug use: Never   Sexual activity: Not on file  Other Topics Concern   Not on file  Social History Narrative   Not on file   Social Determinants of Health   Financial Resource Strain: Not on file  Food Insecurity: Not on file  Transportation Needs: Not on file  Physical Activity: Not on file  Stress: Not on file  Social Connections: Not on file  Intimate Partner Violence: Not on file     Family History  Problem Relation Age of Onset   Diabetes Mellitus II Mother    Diabetes Mellitus II Father      Current Outpatient Medications:    blood glucose meter kit and supplies, Dispense based on patient and insurance preference. Use up to four times daily as directed. (FOR ICD-10 E10.9, E11.9)., Disp: 1 each, Rfl: 3   glimepiride (AMARYL) 2 MG tablet, Take 1 tablet (2 mg total) by mouth 2 (two) times daily at 10 AM and 5 PM., Disp: 180 tablet, Rfl: 3   Iron-FA-B Cmp-C-Biot-Probiotic (FUSION PLUS) CAPS, Take 1 capsule by mouth daily., Disp: 30 capsule, Rfl: 1   glucose blood (FREESTYLE TEST STRIPS) test strip, Use as instructed, Disp: 100 each, Rfl: 6   sitaGLIPtin-metformin (JANUMET) 50-1000 MG tablet, [name], Disp: 180 tablet, Rfl: 3   Physical exam:  Vitals:   09/20/22 1445  BP: 126/80  Pulse: 79  Resp: 18  Temp: 97.6 F (36.4 C)  TempSrc: Tympanic  SpO2: 100%  Weight: 194 lb 8 oz (88.2 kg)  Height: 5\' 11"  (1.803 m)   Physical Exam Cardiovascular:     Rate and Rhythm: Normal rate and regular rhythm.     Heart sounds: Normal heart sounds.   Pulmonary:     Effort: Pulmonary effort is normal.     Breath sounds: Normal breath sounds.  Abdominal:     General: Bowel sounds are normal.     Palpations: Abdomen is soft.  Skin:    General: Skin is warm and dry.  Neurological:     Mental Status: He is alert and oriented to person, place, and time.           Latest Ref Rng & Units 08/30/2022    9:31 AM  CMP  Glucose 70 - 99 mg/dL 165   BUN 6 - 24 mg/dL 14   Creatinine 0.76 - 1.27 mg/dL 1.00   Sodium 134 - 144 mmol/L 139   Potassium 3.5 - 5.2 mmol/L 4.2   Chloride 96 - 106 mmol/L 103   CO2 20 - 29 mmol/L 23   Calcium 8.7 - 10.2 mg/dL 9.2   Total Protein 6.0 - 8.5 g/dL 6.8   Total Bilirubin 0.0 -  1.2 mg/dL 0.4   Alkaline Phos 44 - 121 IU/L 91   AST 0 - 40 IU/L 15   ALT 0 - 44 IU/L 13       Latest Ref Rng & Units 08/30/2022    9:31 AM  CBC  WBC 3.4 - 10.8 x10E3/uL 5.6   Hemoglobin 13.0 - 17.7 g/dL 10.6   Hematocrit 37.5 - 51.0 % 35.6   Platelets 150 - 450 x10E3/uL 204     No images are attached to the encounter.  No results found.  Assessment and plan- Patient is a 57 y.o. male referred for iron deficiency anemia  Patient has had longstanding anemia with a hemoglobin that has remained stable around 10 at least for the last 3 years.  He has had a complete GI workup including EGD colonoscopy and capsule endoscopy that was negative for any bleeding or malignancy.  Labs are clearly indicative of iron deficiency anemia.  He reports some ongoing fatigue.  He does not have insurance and we will await for IV iron to be available for compassionate use.  Discussed risks and benefits of IV iron including all but not limited to possible risk of infusion anaphylactic reaction.  Patient understands and agrees to proceed as planned.  I will be doing a stool H. pylori antigen testing as well as urinalysis.  CBC ferritin and iron studies in 3 and 6 months and I will see him back in 6 months   Thank you for this kind referral and the  opportunity to participate in the care of this patient   Visit Diagnosis 1. Other iron deficiency anemia     Dr. Randa Evens, MD, MPH [facility] [id number] 09/20/2022

## 2022-09-20 NOTE — Progress Notes (Signed)
Patient states that he did blood work and the results should be in the system.

## 2022-09-23 ENCOUNTER — Other Ambulatory Visit: Payer: Self-pay | Admitting: Oncology

## 2022-09-25 ENCOUNTER — Encounter: Payer: Self-pay | Admitting: Oncology

## 2022-09-30 MED FILL — Iron Sucrose Inj 20 MG/ML (Fe Equiv): INTRAVENOUS | Qty: 10 | Status: AC

## 2022-10-03 ENCOUNTER — Inpatient Hospital Stay: Payer: Self-pay | Attending: Oncology

## 2022-10-03 ENCOUNTER — Inpatient Hospital Stay: Payer: Self-pay

## 2022-10-03 VITALS — BP 121/68 | HR 85

## 2022-10-03 DIAGNOSIS — D508 Other iron deficiency anemias: Secondary | ICD-10-CM

## 2022-10-03 DIAGNOSIS — D509 Iron deficiency anemia, unspecified: Secondary | ICD-10-CM | POA: Insufficient documentation

## 2022-10-03 MED ORDER — SODIUM CHLORIDE 0.9% FLUSH
10.0000 mL | Freq: Once | INTRAVENOUS | Status: AC | PRN
  Administered 2022-10-03: 10 mL
  Filled 2022-10-03: qty 10

## 2022-10-03 MED ORDER — SODIUM CHLORIDE 0.9 % IV SOLN
200.0000 mg | INTRAVENOUS | Status: DC
  Administered 2022-10-03: 200 mg via INTRAVENOUS
  Filled 2022-10-03: qty 200

## 2022-10-03 MED ORDER — SODIUM CHLORIDE 0.9 % IV SOLN
Freq: Once | INTRAVENOUS | Status: AC
  Filled 2022-10-03: qty 250

## 2022-10-03 NOTE — Progress Notes (Signed)
Patient tolerated Venofer infusion well. Explained recommendation of 30 min post monitoring. Patient refused to wait post monitoring. Educated on what signs to watch for & to call with any concerns. No questions, discharged. Stable  

## 2022-10-03 NOTE — Patient Instructions (Signed)

## 2022-10-05 ENCOUNTER — Inpatient Hospital Stay: Payer: Self-pay

## 2022-10-11 ENCOUNTER — Inpatient Hospital Stay: Payer: Self-pay

## 2022-10-11 VITALS — BP 123/70 | HR 73 | Temp 98.6°F | Resp 16

## 2022-10-11 DIAGNOSIS — D508 Other iron deficiency anemias: Secondary | ICD-10-CM

## 2022-10-11 MED ORDER — SODIUM CHLORIDE 0.9 % IV SOLN
Freq: Once | INTRAVENOUS | Status: AC
  Filled 2022-10-11: qty 250

## 2022-10-11 MED ORDER — SODIUM CHLORIDE 0.9 % IV SOLN
200.0000 mg | INTRAVENOUS | Status: DC
  Administered 2022-10-11: 200 mg via INTRAVENOUS
  Filled 2022-10-11: qty 200

## 2022-10-11 NOTE — Progress Notes (Signed)
Declined 30 minute observation post- venofer. Vitals stable at discharge.

## 2022-10-11 NOTE — Patient Instructions (Signed)

## 2022-10-12 ENCOUNTER — Inpatient Hospital Stay: Payer: Self-pay

## 2022-10-13 MED FILL — Iron Sucrose Inj 20 MG/ML (Fe Equiv): INTRAVENOUS | Qty: 10 | Status: AC

## 2022-10-14 ENCOUNTER — Inpatient Hospital Stay: Payer: Self-pay

## 2022-10-14 MED FILL — Iron Sucrose Inj 20 MG/ML (Fe Equiv): INTRAVENOUS | Qty: 10 | Status: AC

## 2022-10-17 ENCOUNTER — Inpatient Hospital Stay: Payer: Self-pay

## 2022-10-20 ENCOUNTER — Inpatient Hospital Stay: Payer: Self-pay

## 2022-10-20 VITALS — BP 121/72 | HR 64 | Temp 98.1°F | Resp 18

## 2022-10-20 DIAGNOSIS — D508 Other iron deficiency anemias: Secondary | ICD-10-CM

## 2022-10-20 MED ORDER — SODIUM CHLORIDE 0.9 % IV SOLN
Freq: Once | INTRAVENOUS | Status: AC
  Filled 2022-10-20: qty 250

## 2022-10-20 MED ORDER — SODIUM CHLORIDE 0.9 % IV SOLN
200.0000 mg | INTRAVENOUS | Status: DC
  Administered 2022-10-20: 200 mg via INTRAVENOUS
  Filled 2022-10-20: qty 200

## 2022-10-20 NOTE — Patient Instructions (Signed)

## 2022-10-25 MED FILL — Iron Sucrose Inj 20 MG/ML (Fe Equiv): INTRAVENOUS | Qty: 10 | Status: AC

## 2022-10-26 ENCOUNTER — Inpatient Hospital Stay: Payer: Self-pay | Attending: Oncology

## 2022-10-27 MED FILL — Iron Sucrose Inj 20 MG/ML (Fe Equiv): INTRAVENOUS | Qty: 10 | Status: AC

## 2022-10-28 ENCOUNTER — Inpatient Hospital Stay: Payer: Self-pay

## 2022-11-01 ENCOUNTER — Inpatient Hospital Stay: Payer: Self-pay

## 2022-11-09 MED FILL — Iron Sucrose Inj 20 MG/ML (Fe Equiv): INTRAVENOUS | Qty: 10 | Status: AC

## 2022-11-10 ENCOUNTER — Inpatient Hospital Stay: Payer: Self-pay

## 2022-11-11 ENCOUNTER — Encounter: Payer: Self-pay | Admitting: Oncology

## 2022-11-11 ENCOUNTER — Telehealth: Payer: Self-pay | Admitting: Oncology

## 2022-11-11 MED FILL — Iron Sucrose Inj 20 MG/ML (Fe Equiv): INTRAVENOUS | Qty: 10 | Status: AC

## 2022-11-14 ENCOUNTER — Inpatient Hospital Stay: Payer: Self-pay

## 2022-12-21 ENCOUNTER — Other Ambulatory Visit: Payer: Self-pay

## 2023-03-24 ENCOUNTER — Other Ambulatory Visit: Payer: Self-pay

## 2023-03-24 ENCOUNTER — Ambulatory Visit: Payer: Self-pay | Admitting: Oncology

## 2023-04-03 MED ORDER — OMEPRAZOLE 40 MG PO CPDR: 40.0000 mg | DELAYED_RELEASE_CAPSULE | Freq: Every day | ORAL | 0 refills | Status: AC

## 2023-04-03 NOTE — Addendum Note (Signed)
Addended by: Mordecai Rasmussen on: 04/03/2023 03:32 PM   Modules accepted: Orders

## 2023-06-29 ENCOUNTER — Encounter: Payer: Self-pay | Admitting: Oncology

## 2023-06-29 NOTE — Telephone Encounter (Signed)
 Created in error

## 2023-07-29 ENCOUNTER — Other Ambulatory Visit: Payer: Self-pay | Admitting: Internal Medicine

## 2023-07-29 DIAGNOSIS — E1149 Type 2 diabetes mellitus with other diabetic neurological complication: Secondary | ICD-10-CM

## 2023-07-29 MED ORDER — OMNIPOD 5 DEXG7G6 INTRO GEN 5 KIT: 1.0000 | PACK | Freq: Every day | 0 refills | Status: AC

## 2023-11-18 ENCOUNTER — Other Ambulatory Visit: Payer: Self-pay | Admitting: Internal Medicine

## 2023-11-18 DIAGNOSIS — E1149 Type 2 diabetes mellitus with other diabetic neurological complication: Secondary | ICD-10-CM

## 2023-11-18 MED ORDER — PIOGLITAZONE HCL 15 MG PO TABS
15.0000 mg | ORAL_TABLET | Freq: Every day | ORAL | 2 refills | Status: DC
Start: 2023-11-18 — End: 2024-05-11

## 2024-05-11 ENCOUNTER — Encounter: Payer: Self-pay | Admitting: Oncology

## 2024-05-11 ENCOUNTER — Encounter: Payer: Self-pay | Admitting: Internal Medicine

## 2024-05-11 ENCOUNTER — Ambulatory Visit: Payer: Self-pay | Admitting: Internal Medicine

## 2024-05-11 ENCOUNTER — Other Ambulatory Visit: Payer: Self-pay

## 2024-05-11 VITALS — BP 155/68 | HR 86 | Temp 97.3°F | Resp 86 | Ht 70.47 in | Wt 188.0 lb

## 2024-05-11 DIAGNOSIS — E1149 Type 2 diabetes mellitus with other diabetic neurological complication: Secondary | ICD-10-CM

## 2024-05-11 MED ORDER — PIOGLITAZONE HCL 45 MG PO TABS
45.0000 mg | ORAL_TABLET | Freq: Every day | ORAL | 1 refills | Status: AC
  Filled 2024-05-11: qty 90, 90d supply, fill #0

## 2024-05-11 MED ORDER — GLIMEPIRIDE 4 MG PO TABS: 4.0000 mg | ORAL_TABLET | Freq: Every day | ORAL | 3 refills | Status: AC

## 2024-05-11 MED ORDER — GLIMEPIRIDE 4 MG PO TABS
4.0000 mg | ORAL_TABLET | Freq: Every day | ORAL | 3 refills | Status: DC
Start: 2024-05-11 — End: 2024-05-11
  Filled 2024-05-11: qty 90, 90d supply, fill #0

## 2024-05-11 NOTE — Addendum Note (Signed)
 Addended by: BRYCE MARKER AHMAD on: 05/11/2024 11:08 AM   Modules accepted: Orders

## 2024-05-11 NOTE — Progress Notes (Signed)
 Established patient visit      Patient: Timothy Hancock   DOB: 10-Apr-1966   58 y.o. Male  MRN: [id number] Visit Date: 05/11/2024  Today'Alante Weimann healthcare provider: Sherrill Cinderella Perry, MD  Subjective:    Chief Complaint  Patient presents with   Follow-up    Pt in for diabetes, reports vision issues after getting an eye exam done 4wks ago    No new complaints, here for lab review and medication refills. Home BG has been high        Medications: Outpatient Medications Prior to Visit  Medication Sig   blood glucose meter kit and supplies Dispense based on patient and insurance preference. Use up to four times daily as directed. (FOR ICD-10 E10.9, E11.9).   glimepiride  (AMARYL ) 2 MG tablet Take 1 tablet (2 mg total) by mouth 2 (two) times daily at 10 AM and 5 PM.   glucose blood (FREESTYLE TEST STRIPS) test strip Use as instructed   Insulin  Disposable Pump (OMNIPOD 5 [id number] INTRO GEN 5) KIT 1 Device by Does not apply route daily in the afternoon.   Iron -FA-B Cmp-C-Biot-Probiotic (FUSION PLUS) CAPS Take 1 capsule by mouth daily.   omeprazole  (PRILOSEC) 40 MG capsule Take 1 capsule (40 mg total) by mouth daily.   pioglitazone  (ACTOS ) 15 MG tablet Take 1 tablet (15 mg total) by mouth daily.   sitaGLIPtin -metformin  (JANUMET ) 50-1000 MG tablet [name]   No facility-administered medications prior to visit.    Review of Systems  Endocrine: Negative for polydipsia and polyuria.  All other systems reviewed and are negative.       Objective:    BP (!) 155/68 (BP Location: Left Arm, Patient Position: Sitting, Cuff Size: Normal)   Pulse 86   Temp (!) 97.3 F (36.3 C) (Temporal)   Resp (!) 86   Ht 5' 10.47 (1.79 m)   Wt 188 lb (85.3 kg)   SpO2 98%   BMI 26.62 kg/m    Physical Exam Vitals reviewed.  Constitutional:      Appearance: Normal appearance.  HENT:     Head: Normocephalic.     Left Ear: There is no impacted cerumen.     Nose: Nose normal.      Mouth/Throat:     Mouth: Mucous membranes are moist.     Pharynx: No posterior oropharyngeal erythema.  Eyes:     Extraocular Movements: Extraocular movements intact.     Pupils: Pupils are equal, round, and reactive to light.  Cardiovascular:     Rate and Rhythm: Regular rhythm.     Chest Wall: PMI is not displaced.     Pulses: Normal pulses.     Heart sounds: Normal heart sounds. No murmur heard. Pulmonary:     Effort: Pulmonary effort is normal.     Breath sounds: Normal air entry. No rhonchi or rales.  Abdominal:     General: Abdomen is flat. Bowel sounds are normal. There is no distension.     Palpations: Abdomen is soft. There is no hepatomegaly, splenomegaly or mass.     Tenderness: There is no abdominal tenderness.  Musculoskeletal:        General: Normal range of motion.     Cervical back: Normal range of motion and neck supple.     Right lower leg: No edema.     Left lower leg: No edema.  Skin:    General: Skin is warm and dry.  Neurological:     General: No focal deficit  present.     Mental Status: He is alert and oriented to person, place, and time.     Cranial Nerves: No cranial nerve deficit.     Motor: No weakness.  Psychiatric:        Mood and Affect: Mood normal.        Behavior: Behavior normal.      No results found for any visits on 05/11/24.    Assessment & Plan:    Shiquan was seen today for follow-up.  Type 2 diabetes mellitus with other neurologic complication, with long-term current use of insulin  (HCC)   Uncontrolled-increase pioglitazone  and glimepiride      Sherrill Cinderella Perry, MD  [facility] [phone] (phone) [phone] (fax)  [facility]

## 2024-05-12 ENCOUNTER — Other Ambulatory Visit: Payer: Self-pay

## 2024-05-12 MED ORDER — DAPAGLIFLOZIN PROPANEDIOL 10 MG PO TABS: 10.0000 mg | ORAL_TABLET | Freq: Every day | ORAL | 3 refills | Status: AC

## 2024-05-27 ENCOUNTER — Other Ambulatory Visit: Payer: Self-pay

## 2024-06-05 ENCOUNTER — Other Ambulatory Visit: Payer: Self-pay | Admitting: Medical Genetics

## 2024-06-21 ENCOUNTER — Other Ambulatory Visit: Payer: Self-pay

## 2024-07-27 ENCOUNTER — Other Ambulatory Visit: Payer: Self-pay

## 2024-07-27 ENCOUNTER — Encounter: Payer: Self-pay | Admitting: Oncology

## 2024-07-27 MED ORDER — DAPAGLIFLOZIN PROPANEDIOL 10 MG PO TABS
10.0000 mg | ORAL_TABLET | Freq: Every day | ORAL | 3 refills | Status: AC
  Filled 2024-07-27: qty 30, 30d supply, fill #0

## 2024-08-03 ENCOUNTER — Ambulatory Visit: Payer: Self-pay | Admitting: Internal Medicine
